# Patient Record
Sex: Female | Born: 1985 | Race: Black or African American | Hispanic: No | Marital: Single | Smoking: Current every day smoker
Health system: Southern US, Community
[De-identification: ages and names within clinical notes are randomized; demographics above are authoritative.]

## PROBLEM LIST (undated history)

## (undated) ENCOUNTER — Inpatient Hospital Stay (HOSPITAL_COMMUNITY): Payer: Self-pay

## (undated) DIAGNOSIS — O24419 Gestational diabetes mellitus in pregnancy, unspecified control: Secondary | ICD-10-CM

## (undated) DIAGNOSIS — R87629 Unspecified abnormal cytological findings in specimens from vagina: Secondary | ICD-10-CM

## (undated) HISTORY — PX: INDUCED ABORTION: SHX677

## (undated) HISTORY — PX: COLPOSCOPY: SHX161

---

## 2007-09-13 ENCOUNTER — Other Ambulatory Visit: Admission: RE | Admit: 2007-09-13 | Discharge: 2007-09-13 | Payer: Self-pay | Admitting: Gynecology

## 2007-11-18 ENCOUNTER — Ambulatory Visit: Payer: Self-pay | Admitting: Gynecology

## 2008-09-08 ENCOUNTER — Emergency Department (HOSPITAL_COMMUNITY): Admission: EM | Admit: 2008-09-08 | Discharge: 2008-09-08 | Payer: Self-pay | Admitting: Emergency Medicine

## 2009-04-25 ENCOUNTER — Other Ambulatory Visit: Admission: RE | Admit: 2009-04-25 | Discharge: 2009-04-25 | Payer: Self-pay | Admitting: Gynecology

## 2009-04-25 ENCOUNTER — Ambulatory Visit: Payer: Self-pay | Admitting: Gynecology

## 2009-11-14 ENCOUNTER — Emergency Department (HOSPITAL_COMMUNITY): Admission: EM | Admit: 2009-11-14 | Discharge: 2009-11-14 | Payer: Self-pay | Admitting: Emergency Medicine

## 2009-12-09 ENCOUNTER — Ambulatory Visit: Payer: Self-pay | Admitting: Gynecology

## 2010-05-28 ENCOUNTER — Encounter: Payer: Self-pay | Admitting: Gynecology

## 2010-05-29 ENCOUNTER — Other Ambulatory Visit (HOSPITAL_COMMUNITY)
Admission: RE | Admit: 2010-05-29 | Discharge: 2010-05-29 | Disposition: A | Discharge status: Home / Self Care | Payer: Managed Care, Other (non HMO) | Source: Ambulatory Visit | Attending: Gynecology | Admitting: Gynecology

## 2010-05-29 ENCOUNTER — Encounter (INDEPENDENT_AMBULATORY_CARE_PROVIDER_SITE_OTHER): Payer: Managed Care, Other (non HMO) | Admitting: Gynecology

## 2010-05-29 ENCOUNTER — Other Ambulatory Visit: Payer: Self-pay | Admitting: Gynecology

## 2010-05-29 DIAGNOSIS — Z113 Encounter for screening for infections with a predominantly sexual mode of transmission: Secondary | ICD-10-CM

## 2010-05-29 DIAGNOSIS — R635 Abnormal weight gain: Secondary | ICD-10-CM

## 2010-05-29 DIAGNOSIS — Z833 Family history of diabetes mellitus: Secondary | ICD-10-CM

## 2010-05-29 DIAGNOSIS — Z124 Encounter for screening for malignant neoplasm of cervix: Secondary | ICD-10-CM | POA: Insufficient documentation

## 2010-05-29 DIAGNOSIS — B373 Candidiasis of vulva and vagina: Secondary | ICD-10-CM

## 2010-05-29 DIAGNOSIS — N898 Other specified noninflammatory disorders of vagina: Secondary | ICD-10-CM

## 2010-05-29 DIAGNOSIS — R82998 Other abnormal findings in urine: Secondary | ICD-10-CM

## 2010-05-29 DIAGNOSIS — Z13220 Encounter for screening for lipoid disorders: Secondary | ICD-10-CM

## 2010-05-29 DIAGNOSIS — Z01419 Encounter for gynecological examination (general) (routine) without abnormal findings: Secondary | ICD-10-CM

## 2010-09-05 ENCOUNTER — Emergency Department (HOSPITAL_COMMUNITY)
Admission: EM | Admit: 2010-09-05 | Discharge: 2010-09-05 | Disposition: A | Discharge status: Home / Self Care | Payer: Self-pay | Attending: Emergency Medicine | Admitting: Emergency Medicine

## 2010-09-05 DIAGNOSIS — B9689 Other specified bacterial agents as the cause of diseases classified elsewhere: Secondary | ICD-10-CM | POA: Insufficient documentation

## 2010-09-05 DIAGNOSIS — L02219 Cutaneous abscess of trunk, unspecified: Secondary | ICD-10-CM | POA: Insufficient documentation

## 2010-09-05 DIAGNOSIS — N760 Acute vaginitis: Secondary | ICD-10-CM | POA: Insufficient documentation

## 2010-09-05 DIAGNOSIS — A499 Bacterial infection, unspecified: Secondary | ICD-10-CM | POA: Insufficient documentation

## 2010-09-05 LAB — WET PREP, GENITAL: Yeast Wet Prep HPF POC: NONE SEEN

## 2010-09-08 ENCOUNTER — Emergency Department (HOSPITAL_COMMUNITY)
Admission: EM | Admit: 2010-09-08 | Discharge: 2010-09-08 | Disposition: A | Discharge status: Home / Self Care | Payer: Self-pay | Attending: Emergency Medicine | Admitting: Emergency Medicine

## 2010-09-08 DIAGNOSIS — L03319 Cellulitis of trunk, unspecified: Secondary | ICD-10-CM | POA: Insufficient documentation

## 2010-09-08 DIAGNOSIS — Z09 Encounter for follow-up examination after completed treatment for conditions other than malignant neoplasm: Secondary | ICD-10-CM | POA: Insufficient documentation

## 2010-09-08 DIAGNOSIS — L02219 Cutaneous abscess of trunk, unspecified: Secondary | ICD-10-CM | POA: Insufficient documentation

## 2010-09-08 LAB — GC/CHLAMYDIA PROBE AMP, GENITAL
Chlamydia, DNA Probe: NEGATIVE
GC Probe Amp, Genital: NEGATIVE

## 2014-10-19 ENCOUNTER — Encounter (HOSPITAL_COMMUNITY): Payer: Self-pay | Admitting: Emergency Medicine

## 2014-10-19 ENCOUNTER — Emergency Department (HOSPITAL_COMMUNITY)
Admission: EM | Admit: 2014-10-19 | Discharge: 2014-10-19 | Disposition: A | Discharge status: Home / Self Care | Payer: PRIVATE HEALTH INSURANCE | Attending: Emergency Medicine | Admitting: Emergency Medicine

## 2014-10-19 ENCOUNTER — Emergency Department (HOSPITAL_COMMUNITY): Payer: PRIVATE HEALTH INSURANCE

## 2014-10-19 DIAGNOSIS — Y9289 Other specified places as the place of occurrence of the external cause: Secondary | ICD-10-CM | POA: Insufficient documentation

## 2014-10-19 DIAGNOSIS — R079 Chest pain, unspecified: Secondary | ICD-10-CM | POA: Diagnosis not present

## 2014-10-19 DIAGNOSIS — Z720 Tobacco use: Secondary | ICD-10-CM | POA: Insufficient documentation

## 2014-10-19 DIAGNOSIS — S0993XA Unspecified injury of face, initial encounter: Secondary | ICD-10-CM | POA: Diagnosis present

## 2014-10-19 DIAGNOSIS — S025XXA Fracture of tooth (traumatic), initial encounter for closed fracture: Secondary | ICD-10-CM | POA: Insufficient documentation

## 2014-10-19 DIAGNOSIS — K0889 Other specified disorders of teeth and supporting structures: Secondary | ICD-10-CM

## 2014-10-19 DIAGNOSIS — Y998 Other external cause status: Secondary | ICD-10-CM | POA: Insufficient documentation

## 2014-10-19 DIAGNOSIS — K029 Dental caries, unspecified: Secondary | ICD-10-CM | POA: Insufficient documentation

## 2014-10-19 DIAGNOSIS — Y9389 Activity, other specified: Secondary | ICD-10-CM | POA: Insufficient documentation

## 2014-10-19 DIAGNOSIS — X58XXXA Exposure to other specified factors, initial encounter: Secondary | ICD-10-CM | POA: Insufficient documentation

## 2014-10-19 LAB — BASIC METABOLIC PANEL
ANION GAP: 8 (ref 5–15)
BUN: 10 mg/dL (ref 6–20)
CALCIUM: 9.3 mg/dL (ref 8.9–10.3)
CO2: 23 mmol/L (ref 22–32)
Chloride: 105 mmol/L (ref 101–111)
Creatinine, Ser: 0.87 mg/dL (ref 0.44–1.00)
GFR calc Af Amer: >60 mL/min (ref >60)
GLUCOSE: 129 mg/dL — AB (ref 65–99)
Potassium: 3.8 mmol/L (ref 3.5–5.1)
SODIUM: 136 mmol/L (ref 135–145)

## 2014-10-19 LAB — I-STAT TROPONIN, ED: Troponin i, poc: 0 ng/mL (ref 0.00–0.08)

## 2014-10-19 LAB — CBC
HCT: 35.4 % — ABNORMAL LOW (ref 36.0–46.0)
HEMOGLOBIN: 11.7 g/dL — AB (ref 12.0–15.0)
MCH: 29.3 pg (ref 26.0–34.0)
MCHC: 33.1 g/dL (ref 30.0–36.0)
MCV: 88.5 fL (ref 78.0–100.0)
Platelets: 295 10*3/uL (ref 150–400)
RBC: 4 MIL/uL (ref 3.87–5.11)
RDW: 13.6 % (ref 11.5–15.5)
WBC: 11.4 10*3/uL — ABNORMAL HIGH (ref 4.0–10.5)

## 2014-10-19 LAB — I-STAT BETA HCG BLOOD, ED (MC, WL, AP ONLY)

## 2014-10-19 MED ORDER — PENICILLIN V POTASSIUM 500 MG PO TABS: 500.0000 mg | ORAL_TABLET | Freq: Four times a day (QID) | ORAL | Status: DC

## 2014-10-19 MED ORDER — HYDROCODONE-ACETAMINOPHEN 5-325 MG PO TABS: 1.0000 | ORAL_TABLET | Freq: Four times a day (QID) | ORAL | Status: DC | PRN

## 2014-10-19 MED ORDER — OXYCODONE-ACETAMINOPHEN 5-325 MG PO TABS
1.0000 | ORAL_TABLET | Freq: Once | ORAL | Status: AC
  Administered 2014-10-19: 1 via ORAL
  Filled 2014-10-19: qty 1

## 2014-10-19 NOTE — Discharge Instructions (Signed)
Return here as needed.  Follow-up with the dentist provided.  Also follow-[facility] with [facility]

## 2014-10-19 NOTE — ED Notes (Signed)
Pt c/o tooth that broke on left later lower side and causing severe pain on left side of face. Pt also c/o right to mid chest pain.

## 2014-10-19 NOTE — ED Provider Notes (Signed)
CSN: [id number]     Arrival date & time 10/19/14  1739 History   First MD Initiated Contact with Patient 10/19/14 1842     Chief Complaint  Patient presents with  . Dental Pain  . Chest Pain     (Consider location/radiation/quality/duration/timing/severity/associated sxs/prior Treatment) HPI Patient presents to the emergency department with dental pain from a broken tooth in the left lower dentition.  Patient states that she is also having right lateral chest pain that she feels it radiates from her jaw or her tooth is broken.  Patient states that nothing seems make her condition better The patient states that cold air makes her pain worse and palpation.  Patient states that she did not take any medications prior to arrival.  The patient states that the chest pain is not constant, it always lasts for a few seconds at a time.  The patient denies shortness of breath, weakness, dizziness, headache, blurred vision, back pain, neck pain, fever, cough, improved.  Abdominal pain, dysuria, incontinence, or syncope.  The patient states that she was chewing on a potato chip she cracked her tooth History reviewed. No pertinent past medical history. History reviewed. No pertinent past surgical history. No family history on file. Social History  Substance Use Topics  . Smoking status: Current Every Day Smoker    Types: Cigarettes  . Smokeless tobacco: None  . Alcohol Use: No   OB History    No data available     Review of Systems   All other systems negative except as documented in the HPI. All pertinent positives and negatives as reviewed in the HPI.= Allergies  Review of patient's allergies indicates no known allergies.  Home Medications   Prior to Admission medications   Not on File   BP 126/73 mmHg  Pulse 92  Temp(Src) 98.4 F (36.9 C)  Resp 13  Ht 5\' 7"  (1.702 m)  Wt 190 lb (86.183 kg)  BMI 29.75 kg/m2  SpO2 100%  LMP  Physical Exam  Constitutional: She is oriented to  person, place, and time. She appears well-developed and well-nourished. No distress.  HENT:  Head: Normocephalic and atraumatic.  Mouth/Throat: Uvula is midline, oropharynx is clear and moist and mucous membranes are normal. She does not have dentures. No trismus in the jaw. Abnormal dentition. Dental caries present. No dental abscesses or uvula swelling.    Eyes: Pupils are equal, round, and reactive to light.  Neck: Normal range of motion. Neck supple.  Cardiovascular: Normal rate and regular rhythm.  Exam reveals no gallop and no friction rub.   No murmur heard. Pulmonary/Chest: Effort normal and breath sounds normal. No respiratory distress.  Musculoskeletal: She exhibits no edema.  Neurological: She is alert and oriented to person, place, and time. She exhibits normal muscle tone. Coordination normal.  Skin: Skin is warm and dry. No rash noted. No erythema.  Psychiatric: She has a normal mood and affect. Her behavior is normal.  Nursing note and vitals reviewed.   ED Course  Procedures (including critical care time) Labs Review Labs Reviewed  BASIC METABOLIC PANEL - Abnormal; Notable for the following:    Glucose, Bld 129 (*)    All other components within normal limits  CBC - Abnormal; Notable for the following:    WBC 11.4 (*)    Hemoglobin 11.7 (*)    HCT 35.4 (*)    All other components within normal limits  I-STAT BETA HCG BLOOD, ED (MC, WL, AP ONLY) - Abnormal; Notable  for the following:    I-stat hCG, quantitative >2000.0 (*)    All other components within normal limits  Rosezena Sensor, ED    Imaging Review Dg Chest 2 View  10/19/2014   CLINICAL DATA:  Right-sided chest pain for 2 days.  EXAM: CHEST  2 VIEW  COMPARISON:  None.  FINDINGS: The heart size and mediastinal contours are within normal limits. Both lungs are clear. No evidence of pneumothorax or pleural effusion. The visualized skeletal structures are unremarkable.  IMPRESSION: Negative.  No active  cardiopulmonary disease.   Electronically Signed   By: Myles Rosenthal M.D.   On: 10/19/2014 19:17   I have personally reviewed and evaluated these images and lab results as part of my medical decision-making.   EKG Interpretation   Date/Time:  Friday October 19 2014 17:48:47 EDT Ventricular Rate:  110 PR Interval:  134 QRS Duration: 78 QT Interval:  313 QTC Calculation: 423 R Axis:   77 Text Interpretation:  Sinus tachycardia ED PHYSICIAN INTERPRETATION  AVAILABLE IN [facility] Confirmed by TEST, Record ([id number]) on  10/20/2014 2:59:47 PM        Patient was advised that she is pregnant.  Advised her to minimally use pain medication and follow up with the dentist provided.  Told to return here as needed.  Given [facility] hospital.  Follow up as well.  She does not have any abdominal pain, vaginal bleeding, vaginal discharge or other abdominal related symptoms  Charlestine Night, PA-C 10/22/14 0117  Lorre Nick, MD 10/23/14 [phone]

## 2014-10-31 ENCOUNTER — Encounter (HOSPITAL_COMMUNITY): Payer: Self-pay | Admitting: Emergency Medicine

## 2014-10-31 ENCOUNTER — Emergency Department (HOSPITAL_COMMUNITY)
Admission: EM | Admit: 2014-10-31 | Discharge: 2014-10-31 | Disposition: A | Discharge status: Home / Self Care | Payer: PRIVATE HEALTH INSURANCE | Attending: Emergency Medicine | Admitting: Emergency Medicine

## 2014-10-31 DIAGNOSIS — K029 Dental caries, unspecified: Secondary | ICD-10-CM | POA: Insufficient documentation

## 2014-10-31 DIAGNOSIS — Z792 Long term (current) use of antibiotics: Secondary | ICD-10-CM | POA: Insufficient documentation

## 2014-10-31 DIAGNOSIS — K088 Other specified disorders of teeth and supporting structures: Secondary | ICD-10-CM | POA: Diagnosis present

## 2014-10-31 DIAGNOSIS — Z720 Tobacco use: Secondary | ICD-10-CM | POA: Insufficient documentation

## 2014-10-31 MED ORDER — IBUPROFEN 800 MG PO TABS
800.0000 mg | ORAL_TABLET | Freq: Once | ORAL | Status: AC
  Administered 2014-10-31: 800 mg via ORAL
  Filled 2014-10-31: qty 1

## 2014-10-31 MED ORDER — IBUPROFEN 800 MG PO TABS: 800.0000 mg | ORAL_TABLET | Freq: Three times a day (TID) | ORAL | Status: DC

## 2014-10-31 NOTE — ED Provider Notes (Signed)
CSN: [id number]     Arrival date & time 10/31/14  1905 History  This chart was scribed for Elpidio Anis, working with Mirian Mo, MD by Chestine Spore, ED Scribe. The patient was seen in room WTR7/WTR7 at 8:16 PM.    Chief Complaint  Patient presents with  . Dental Pain      The history is provided by the patient. No language interpreter was used.    Angela Maldonado is a 29 y.o. female who presents to the Emergency Department complaining of chronic left sided dental pain. Pt was seen in the ED on 10/22/14 and was evaluated and Rx vicodin and penicillin Rx. Pt reports that the pain medication that she received is not working for her and she would like a stronger medication. Pt notes that she just moved to the area and she has [employer] and not [place]. She reports that her [facility] ends today. She denies gum swelling/bleeding, fever, chills, and any other symptoms. Pt denies allergies to any medications. Pt is a current smoker.   History reviewed. No pertinent past medical history. History reviewed. No pertinent past surgical history. History reviewed. No pertinent family history. Social History  Substance Use Topics  . Smoking status: Current Every Day Smoker    Types: Cigarettes  . Smokeless tobacco: None  . Alcohol Use: No   OB History    No data available     Review of Systems  Constitutional: Negative for fever and chills.  HENT: Positive for dental problem. Negative for sore throat and trouble swallowing.        No drainage from the area      Allergies  Review of patient's allergies indicates no known allergies.  Home Medications   Prior to Admission medications   Medication Sig Start Date End Date Taking? Authorizing Provider  HYDROcodone-acetaminophen (NORCO/VICODIN) 5-325 MG per tablet Take 1 tablet by mouth every 6 (six) hours as needed for moderate pain. 10/19/14   Charlestine Night, PA-C  penicillin v potassium (VEETID) 500 MG tablet Take 1 tablet (500 mg  total) by mouth 4 (four) times daily. 10/19/14   Christopher Lawyer, PA-C   BP 139/85 mmHg  Pulse 100  Temp(Src) 98.8 F (37.1 C) (Oral)  Resp 16  SpO2 100% Physical Exam  Constitutional: She is oriented to person, place, and time. She appears well-developed and well-nourished. No distress.  HENT:  Head: Normocephalic and atraumatic.  Mouth/Throat: Dental caries present. No dental abscesses.  #18 has large dental caries without visualized abscess or drainage. No facial swelling. No submental adenopathy.  Eyes: EOM are normal.  Neck: Neck supple. No tracheal deviation present.  Cardiovascular: Normal rate.   Pulmonary/Chest: Effort normal. No respiratory distress.  Musculoskeletal: Normal range of motion.  Lymphadenopathy:       Head (right side): No submental adenopathy present.       Head (left side): No submental adenopathy present.  Neurological: She is alert and oriented to person, place, and time.  Skin: Skin is warm and dry.  Psychiatric: She has a normal mood and affect. Her behavior is normal.  Nursing note and vitals reviewed.   ED Course  Procedures (including critical care time) DIAGNOSTIC STUDIES: Oxygen Saturation is 100% on RA, nl by my interpretation.    COORDINATION OF CARE: 8:20 PM Discussed treatment plan with pt at bedside and pt agreed to plan.    Labs Review Labs Reviewed - No data to display  Imaging Review No results found. Elpidio Anis, PA-C,  have personally reviewed and evaluated these images and lab results as part of my medical decision-making.   EKG Interpretation None      MDM   Final diagnoses:  None    1. Dental pain  She is currently on penicillin and Norco and states her pain is not managed. Dental resources provided. Offered to perform apical injection which she declined. Encouraged her to add ibuprofen to current regimen and call for dental follow up.  I personally performed the services described in this documentation,  which was scribed in my presence. The recorded information has been reviewed and is accurate.     Elpidio Anis, PA-C 10/31/14 2248  Mirian Mo, MD 11/02/14 [phone]

## 2014-10-31 NOTE — ED Notes (Signed)
Pt c/o chronic l side dental pain. Was seen here a week ago and rx'd viocdin and reports its not working.  Wants a stronger pain medication.

## 2014-10-31 NOTE — Discharge Instructions (Signed)
Dental Care and Dentist Visits Dental care supports good overall health. Regular dental visits can also help you avoid dental pain, bleeding, infection, and other more serious health problems in the future. It is important to keep the mouth healthy because diseases in the teeth, gums, and other oral tissues can spread to other areas of the body. Some problems, such as diabetes, heart disease, and pre-term labor have been associated with poor oral health.  See your dentist every 6 months. If you experience emergency problems such as a toothache or broken tooth, go to the dentist right away. If you see your dentist regularly, you may catch problems early. It is easier to be treated for problems in the early stages.  WHAT TO EXPECT AT A DENTIST VISIT  Your dentist will look for many common oral health problems and recommend proper treatment. At your regular dental visit, you can expect:  Gentle cleaning of the teeth and gums. This includes scraping and polishing. This helps to remove the sticky substance around the teeth and gums (plaque). Plaque forms in the mouth shortly after eating. Over time, plaque hardens on the teeth as tartar. If tartar is not removed regularly, it can cause problems. Cleaning also helps remove stains.  Periodic X-rays. These pictures of the teeth and supporting bone will help your dentist assess the health of your teeth.  Periodic fluoride treatments. Fluoride is a natural mineral shown to help strengthen teeth. Fluoride treatmentinvolves applying a fluoride gel or varnish to the teeth. It is most commonly done in children.  Examination of the mouth, tongue, jaws, teeth, and gums to look for any oral health problems, such as:  Cavities (dental caries). This is decay on the tooth caused by plaque, sugar, and acid in the mouth. It is best to catch a cavity when it is small.  Inflammation of the gums caused by plaque buildup (gingivitis).  Problems with the mouth or malformed  or misaligned teeth.  Oral cancer or other diseases of the soft tissues or jaws. KEEP YOUR TEETH AND GUMS HEALTHY For healthy teeth and gums, follow these general guidelines as well as your dentist's specific advice:  Have your teeth professionally cleaned at the dentist every 6 months.  Brush twice daily with a fluoride toothpaste.  Floss your teeth daily.  Ask your dentist if you need fluoride supplements, treatments, or fluoride toothpaste.  Eat a healthy diet. Reduce foods and drinks with added sugar.  Avoid smoking. TREATMENT FOR ORAL HEALTH PROBLEMS If you have oral health problems, treatment varies depending on the conditions present in your teeth and gums.  Your caregiver will most likely recommend good oral hygiene at each visit.  For cavities, gingivitis, or other oral health disease, your caregiver will perform a procedure to treat the problem. This is typically done at a separate appointment. Sometimes your caregiver will refer you to another dental specialist for specific tooth problems or for surgery. SEEK IMMEDIATE DENTAL CARE IF:  You have pain, bleeding, or soreness in the gum, tooth, jaw, or mouth area.  A permanent tooth becomes loose or separated from the gum socket.  You experience a blow or injury to the mouth or jaw area. Document Released: 10/29/2010 Document Revised: 05/11/2011 Document Reviewed: 10/29/2010 [facility] Patient Information 2015 [employer] Stein[employer], [place]. This information is not intended to replace advice given to you by your health care provider. Make sure you discuss any questions you have with your health care provider.  Dental Caries Dental caries is tooth decay. This  decay can cause a hole in teeth (cavity) that can get bigger and deeper over time. HOME CARE  Brush and floss your teeth. Do this at least two times a day.  Use a fluoride toothpaste.  Use a mouth rinse if told by your dentist or doctor.  Eat less sugary and starchy foods.  Drink less sugary drinks.  Avoid snacking often on sugary and starchy foods. Avoid sipping often on sugary drinks.  Keep regular checkups and cleanings with your dentist.  Use fluoride supplements if told by your dentist or doctor.  Allow fluoride to be applied to teeth if told by your dentist or doctor. Document Released: 11/26/2007 Document Revised: 07/03/2013 Document Reviewed: 02/19/2012 [facility] Patient Information 2015 [place], [place]. This information is not intended to replace advice given to you by your health care provider. Make sure you discuss any questions you have with your health care provider.  Emergency Department Resource Guide 1) Find a Doctor and Pay Out of Pocket Although you won't have to find out who is covered by your insurance plan, it is a good idea to ask around and get recommendations. You will then need to call the office and see if the doctor you have chosen will accept you as a new patient and what types of options they offer for patients who are self-pay. Some doctors offer discounts or will set up payment plans for their patients who do not have insurance, but you will need to ask so you aren't surprised when you get to your appointment.  2) Contact Your Local Health Department Not all health departments have doctors that can see patients for sick visits, but many do, so it is worth a call to see if yours does. If you don't know where your local health department is, you can check in your phone book. The CDC also has a tool to help you locate your state's health department, and many state websites also have listings of all of their local health departments.  3) Find a Walk-in Clinic If your illness is not likely to be very severe or complicated, you may want to try a walk in clinic. These are popping up all over the country in pharmacies, drugstores, and shopping centers. They're usually staffed by nurse practitioners or physician assistants that have been trained  to treat common illnesses and complaints. They're usually fairly quick and inexpensive. However, if you have serious medical issues or chronic medical problems, these are probably not your best option.  No Primary Care Doctor: - Call Health Connect at  [phone] - they can help you locate a primary care doctor that  accepts your insurance, provides certain services, etc. - Physician Referral Service- [phone]  Chronic Pain Problems: Organization         Address  Phone   Notes  [facility]  [phone] Patients need to be referred by their primary care doctor.   Medication Assistance: Organization         Address  Phone   Notes  [facility] [address] [place], [place] [phone] --Must be a resident of [facility] [place] [facility] -- Must have NO insurance coverage whatsoever (no Medicaid/ Medicare, etc.) -- The pt. MUST have a primary care doctor that directs their care regularly and follows them in the community   [facility]  [phone]   [employer]  [phone]    Agencies that provide inexpensive medical care: Organization  Address  Phone   Notes  [place]  [phone]   [facility]    [phone]   [facility] [place], [place] [address] [phone]   [place] [address] [place], [place] [phone]   Planned Parenthood    [phone]   [facility]    [phone]   [place] [address], [place] Phone:  [phone], Fax:  [phone] Hours of Operation:  9 am - 6 pm, M-F.  Also accepts Medicaid/Medicare and self-pay.  [facility] for [place], [address], [place] Phone: [phone], Fax: [phone]. Hours of Operation:  8:30 am - 5:30 pm, M-F.  Also accepts Medicaid and self-pay.   [facility] [place] [address] [place] Phone: [phone]   [place], [place], [place] [phone], Ext. [phone] Mondays & Thursdays: 7-9 AM.  First 15 patients are seen on a first come, first serve basis.    [place] Providers:  Organization         Address  Phone   Notes  [facility] [address], [place] [phone] Also accepts self-pay patients.  [facility] [address], [place]  [phone]   [place], [address], [place] [phone]   [facility] [address] [place] [phone]   Lucianne Lei [address], [place]   [phone] Only accepts [place] Access [place] patients after they have their name applied to their card.   Self-Pay (no insurance) in [facility]:  Organization         Address  Phone   Notes  Sickle Cell Patients, [facility] [place] [phone]   [facility] [place] [phone]   [facility] [place], [place], [place] [phone]   [facility]/Dr. Osei-Bonsu  [address], [place] or [address], [place] [phone] Phone number for both [place] and [place] locations is the same.  [facility] [address] [place] [phone]   [facility] [address] [place] or [address] [phone]   [facility] [address], [place] [phone], phone; [phone], fax Sees patients 1st and 3rd Saturday of every month.  Must not qualify for public or private insurance (i.e. Medicaid, Medicare, [place] Health Choice, Veterans' Benefits)  Household income should be no  more than 200% of the poverty level The clinic cannot treat you if you are pregnant or think you are pregnant  Sexually transmitted diseases are not treated at the clinic.    Dental Care: Organization         Address  Phone  Notes  [facility] [place] [phone] Accepts children up to age 69 who are enrolled in [place] or [place]; pregnant women with a Medicaid card; and children who have applied for Medicaid or [place] Health Choice, but were declined, whose parents can pay a reduced fee at time  of service.  [facility]  [address], [place] [phone] Accepts children up to age 36 who are enrolled in [place] or [place] Health Choice; pregnant women with a Medicaid card; and children who have applied for Medicaid or [place] Health Choice, but were declined, whose parents can pay a reduced fee at time of service.  [facility]  [address] [place], [place] [phone] Patients are seen by appointment only. Walk-ins are not accepted. [facility] will see patients 87 years of age and older. Monday - Tuesday (8am-5pm) Most Wednesdays (8:30-5pm) $30 per visit, cash only  [facility]  [address], [facility] [phone] Patients are seen by appointment only. Walk-ins are not accepted. [facility] will see patients 19 years of age and older. One Wednesday Evening (Monthly: Volunteer Based).  $30 per visit, cash only  [employer]  [phone] for adults; Children under age 53, call Graduate Pediatric Dentistry at [phone]. Children aged 47-14, please call [phone] to request a pediatric application.  Dental services are provided in all areas of dental care including fillings, crowns and bridges, complete and partial dentures, implants, gum treatment, root canals,  and extractions. Preventive care is also provided. Treatment is provided to both adults and children. Patients are selected via a lottery and there is often a waiting list.   [facility] [address], [place]  [phone] [email or web address]   [facility] [address] [place], [place] [phone], Ext. [phone] Second and Fourth Thursday of each month, opens at 6:30 AM; Clinic ends at 9 AM.  Patients are seen on a first-come first-served basis, and a limited number are seen during each clinic.   [facility]  [address] [place], [place] [phone]   Eligibility Requirements You must have lived in [place], [place], or [place] counties for at least the last three months.   You cannot be eligible for state or federal sponsored [place], including [employer], [place], or [employer].   You generally cannot be eligible for healthcare insurance through your employer.    How to apply: Eligibility screenings are held every Tuesday and Wednesday afternoon from 1:00 pm until 4:00 pm. You do not need an appointment for the interview!  [facility] [address] [place], [place] [phone]   [facility] Health Department  [phone]   [facility]  [phone]   [facility]  [phone]

## 2014-11-26 ENCOUNTER — Inpatient Hospital Stay (HOSPITAL_COMMUNITY): Payer: Medicaid Other

## 2014-11-26 ENCOUNTER — Inpatient Hospital Stay (HOSPITAL_COMMUNITY)
Admission: AD | Admit: 2014-11-26 | Discharge: 2014-11-26 | Disposition: A | Discharge status: Home / Self Care | Payer: Medicaid Other | Source: Ambulatory Visit | Attending: Family Medicine | Admitting: Family Medicine

## 2014-11-26 ENCOUNTER — Encounter (HOSPITAL_COMMUNITY): Payer: Self-pay | Admitting: *Deleted

## 2014-11-26 DIAGNOSIS — R05 Cough: Secondary | ICD-10-CM | POA: Diagnosis present

## 2014-11-26 DIAGNOSIS — R102 Pelvic and perineal pain: Secondary | ICD-10-CM

## 2014-11-26 DIAGNOSIS — R312 Other microscopic hematuria: Secondary | ICD-10-CM | POA: Diagnosis not present

## 2014-11-26 DIAGNOSIS — J069 Acute upper respiratory infection, unspecified: Secondary | ICD-10-CM | POA: Insufficient documentation

## 2014-11-26 DIAGNOSIS — N3001 Acute cystitis with hematuria: Secondary | ICD-10-CM

## 2014-11-26 DIAGNOSIS — Z3A14 14 weeks gestation of pregnancy: Secondary | ICD-10-CM | POA: Insufficient documentation

## 2014-11-26 DIAGNOSIS — F17200 Nicotine dependence, unspecified, uncomplicated: Secondary | ICD-10-CM | POA: Insufficient documentation

## 2014-11-26 DIAGNOSIS — O26892 Other specified pregnancy related conditions, second trimester: Secondary | ICD-10-CM | POA: Insufficient documentation

## 2014-11-26 DIAGNOSIS — O26899 Other specified pregnancy related conditions, unspecified trimester: Secondary | ICD-10-CM

## 2014-11-26 HISTORY — DX: Gestational diabetes mellitus in pregnancy, unspecified control: O24.419

## 2014-11-26 HISTORY — DX: Unspecified abnormal cytological findings in specimens from vagina: R87.629

## 2014-11-26 LAB — URINE MICROSCOPIC-ADD ON

## 2014-11-26 LAB — URINALYSIS, ROUTINE W REFLEX MICROSCOPIC
BILIRUBIN URINE: NEGATIVE
GLUCOSE, UA: NEGATIVE mg/dL
Ketones, ur: NEGATIVE mg/dL
Nitrite: NEGATIVE
PROTEIN: NEGATIVE mg/dL
SPECIFIC GRAVITY, URINE: 1.025 (ref 1.005–1.030)
UROBILINOGEN UA: 0.2 mg/dL (ref 0.0–1.0)
pH: 6.5 (ref 5.0–8.0)

## 2014-11-26 MED ORDER — GUAIFENESIN ER 600 MG PO TB12: 600.0000 mg | ORAL_TABLET | Freq: Two times a day (BID) | ORAL | Status: DC

## 2014-11-26 MED ORDER — PRENATAL VITAMINS 0.8 MG PO TABS: 1.0000 | ORAL_TABLET | Freq: Every day | ORAL | Status: DC

## 2014-11-26 MED ORDER — AZITHROMYCIN 250 MG PO TABS: 250.0000 mg | ORAL_TABLET | Freq: Every day | ORAL | Status: DC

## 2014-11-26 NOTE — Discharge Instructions (Signed)
Second Trimester of Pregnancy °The second trimester is from week 13 through week 28, months 4 through 6. The second trimester is often a time when you feel your best. Your body has also adjusted to being pregnant, and you begin to feel better physically. Usually, morning sickness has lessened or quit completely, you may have more energy, and you may have an increase in appetite. The second trimester is also a time when the fetus is growing rapidly. At the end of the sixth month, the fetus is about 9 inches long and weighs about 1½ pounds. You will likely begin to feel the baby move (quickening) between 18 and 20 weeks of the pregnancy. °BODY CHANGES °Your body goes through many changes during pregnancy. The changes vary from woman to woman.  °· Your weight will continue to increase. You will notice your lower abdomen bulging out. °· You may begin to get stretch marks on your hips, abdomen, and breasts. °· You may develop headaches that can be relieved by medicines approved by your health care provider. °· You may urinate more often because the fetus is pressing on your bladder. °· You may develop or continue to have heartburn as a result of your pregnancy. °· You may develop constipation because certain hormones are causing the muscles that push waste through your intestines to slow down. °· You may develop hemorrhoids or swollen, bulging veins (varicose veins). °· You may have back pain because of the weight gain and pregnancy hormones relaxing your joints between the bones in your pelvis and as a result of a shift in weight and the muscles that support your balance. °· Your breasts will continue to grow and be tender. °· Your gums may bleed and may be sensitive to brushing and flossing. °· Dark spots or blotches (chloasma, mask of pregnancy) may develop on your face. This will likely fade after the baby is born. °· A dark line from your belly button to the pubic area (linea nigra) may appear. This will likely fade  after the baby is born. °· You may have changes in your hair. These can include thickening of your hair, rapid growth, and changes in texture. Some women also have hair loss during or after pregnancy, or hair that feels dry or thin. Your hair will most likely return to normal after your baby is born. °WHAT TO EXPECT AT YOUR PRENATAL VISITS °During a routine prenatal visit: °· You will be weighed to make sure you and the fetus are growing normally. °· Your blood pressure will be taken. °· Your abdomen will be measured to track your baby's growth. °· The fetal heartbeat will be listened to. °· Any test results from the previous visit will be discussed. °Your health care provider may ask you: °· How you are feeling. °· If you are feeling the baby move. °· If you have had any abnormal symptoms, such as leaking fluid, bleeding, severe headaches, or abdominal cramping. °· If you have any questions. °Other tests that may be performed during your second trimester include: °· Blood tests that check for: °¨ Low iron levels (anemia). °¨ Gestational diabetes (between 24 and 28 weeks). °¨ Rh antibodies. °· Urine tests to check for infections, diabetes, or protein in the urine. °· An ultrasound to confirm the proper growth and development of the baby. °· An amniocentesis to check for possible genetic problems. °· Fetal screens for spina bifida and Down syndrome. °HOME CARE INSTRUCTIONS  °· Avoid all smoking, herbs, alcohol, and unprescribed   drugs. These chemicals affect the formation and growth of the baby.  Follow your health care provider's instructions regarding medicine use. There are medicines that are either safe or unsafe to take during pregnancy.  Exercise only as directed by your health care provider. Experiencing uterine cramps is a good sign to stop exercising.  Continue to eat regular, healthy meals.  Wear a good support bra for breast tenderness.  Do not use hot tubs, steam rooms, or saunas.  Wear your  seat belt at all times when driving.  Avoid raw meat, uncooked cheese, cat litter boxes, and soil used by cats. These carry germs that can cause birth defects in the baby.  Take your prenatal vitamins.  Try taking a stool softener (if your health care provider approves) if you develop constipation. Eat more high-fiber foods, such as fresh vegetables or fruit and whole grains. Drink plenty of fluids to keep your urine clear or pale yellow.  Take warm sitz baths to soothe any pain or discomfort caused by hemorrhoids. Use hemorrhoid cream if your health care provider approves.  If you develop varicose veins, wear support hose. Elevate your feet for 15 minutes, 3-4 times a day. Limit salt in your diet.  Avoid heavy lifting, wear low heel shoes, and practice good posture.  Rest with your legs elevated if you have leg cramps or low back pain.  Visit your dentist if you have not gone yet during your pregnancy. Use a soft toothbrush to brush your teeth and be gentle when you floss.  A sexual relationship may be continued unless your health care provider directs you otherwise.  Continue to go to all your prenatal visits as directed by your health care provider. SEEK MEDICAL CARE IF:   You have dizziness.  You have mild pelvic cramps, pelvic pressure, or nagging pain in the abdominal area.  You have persistent nausea, vomiting, or diarrhea.  You have a bad smelling vaginal discharge.  You have pain with urination. SEEK IMMEDIATE MEDICAL CARE IF:   You have a fever.  You are leaking fluid from your vagina.  You have spotting or bleeding from your vagina.  You have severe abdominal cramping or pain.  You have rapid weight gain or loss.  You have shortness of breath with chest pain.  You notice sudden or extreme swelling of your face, hands, ankles, feet, or legs.  You have not felt your baby move in over an hour.  You have severe headaches that do not go away with  medicine.  You have vision changes. Document Released: 02/10/2001 Document Revised: 02/21/2013 Document Reviewed: 04/19/2012 [facility] Patient Information 2015 [place], [place]. This information is not intended to replace advice given to you by your health care provider. Make sure you discuss any questions you have with your health care provider. Upper Respiratory Infection, Adult An upper respiratory infection (URI) is also sometimes known as the common cold. The upper respiratory tract includes the nose, sinuses, throat, trachea, and bronchi. Bronchi are the airways leading to the lungs. Most people improve within 1 week, but symptoms can last up to 2 weeks. A residual cough may last even longer.  CAUSES Many different viruses can infect the tissues lining the upper respiratory tract. The tissues become irritated and inflamed and often become very moist. Mucus production is also common. A cold is contagious. You can easily spread the virus to others by oral contact. This includes kissing, sharing a glass, coughing, or sneezing. Touching your mouth or nose and  then touching a surface, which is then touched by another person, can also spread the virus. SYMPTOMS  Symptoms typically develop 1 to 3 days after you come in contact with a cold virus. Symptoms vary from person to person. They may include:  Runny nose.  Sneezing.  Nasal congestion.  Sinus irritation.  Sore throat.  Loss of voice (laryngitis).  Cough.  Fatigue.  Muscle aches.  Loss of appetite.  Headache.  Low-grade fever. DIAGNOSIS  You might diagnose your own cold based on familiar symptoms, since most people get a cold 2 to 3 times a year. Your caregiver can confirm this based on your exam. Most importantly, your caregiver can check that your symptoms are not due to another disease such as strep throat, sinusitis, pneumonia, asthma, or epiglottitis. Blood tests, throat tests, and X-rays are not necessary to diagnose a  common cold, but they may sometimes be helpful in excluding other more serious diseases. Your caregiver will decide if any further tests are required. RISKS AND COMPLICATIONS  You may be at risk for a more severe case of the common cold if you smoke cigarettes, have chronic heart disease (such as heart failure) or lung disease (such as asthma), or if you have a weakened immune system. The very young and very old are also at risk for more serious infections. Bacterial sinusitis, middle ear infections, and bacterial pneumonia can complicate the common cold. The common cold can worsen asthma and chronic obstructive pulmonary disease (COPD). Sometimes, these complications can require emergency medical care and may be life-threatening. PREVENTION  The best way to protect against getting a cold is to practice good hygiene. Avoid oral or hand contact with people with cold symptoms. Wash your hands often if contact occurs. There is no clear evidence that vitamin C, vitamin E, echinacea, or exercise reduces the chance of developing a cold. However, it is always recommended to get plenty of rest and practice good nutrition. TREATMENT  Treatment is directed at relieving symptoms. There is no cure. Antibiotics are not effective, because the infection is caused by a virus, not by bacteria. Treatment may include:  Increased fluid intake. Sports drinks offer valuable electrolytes, sugars, and fluids.  Breathing heated mist or steam (vaporizer or shower).  Eating chicken soup or other clear broths, and maintaining good nutrition.  Getting plenty of rest.  Using gargles or lozenges for comfort.  Controlling fevers with ibuprofen or acetaminophen as directed by your caregiver.  Increasing usage of your inhaler if you have asthma. Zinc gel and zinc lozenges, taken in the first 24 hours of the common cold, can shorten the duration and lessen the severity of symptoms. Pain medicines may help with fever, muscle  aches, and throat pain. A variety of non-prescription medicines are available to treat congestion and runny nose. Your caregiver can make recommendations and may suggest nasal or lung inhalers for other symptoms.  HOME CARE INSTRUCTIONS   Only take over-the-counter or prescription medicines for pain, discomfort, or fever as directed by your caregiver.  Use a warm mist humidifier or inhale steam from a shower to increase air moisture. This may keep secretions moist and make it easier to breathe.  Drink enough water and fluids to keep your urine clear or pale yellow.  Rest as needed.  Return to work when your temperature has returned to normal or as your caregiver advises. You may need to stay home longer to avoid infecting others. You can also use a face mask and careful hand  washing to prevent spread of the virus. SEEK MEDICAL CARE IF:   After the first few days, you feel you are getting worse rather than better.  You need your caregiver's advice about medicines to control symptoms.  You develop chills, worsening shortness of breath, or brown or red sputum. These may be signs of pneumonia.  You develop yellow or brown nasal discharge or pain in the face, especially when you bend forward. These may be signs of sinusitis.  You develop a fever, swollen neck glands, pain with swallowing, or white areas in the back of your throat. These may be signs of strep throat. SEEK IMMEDIATE MEDICAL CARE IF:   You have a fever.  You develop severe or persistent headache, ear pain, sinus pain, or chest pain.  You develop wheezing, a prolonged cough, cough up blood, or have a change in your usual mucus (if you have chronic lung disease).  You develop sore muscles or a stiff neck. Document Released: 08/12/2000 Document Revised: 05/11/2011 Document Reviewed: 05/24/2013 [facility] Patient Information 2015 [place], [place]. This information is not intended to replace advice given to you by your health care  provider. Make sure you discuss any questions you have with your health care provider.

## 2014-11-26 NOTE — MAU Provider Note (Signed)
History     CSN: [id number]  Arrival date and time: 11/26/14 0840   First Provider Initiated Contact with Patient 11/26/14 1014     This is a 29 y.o. female at [redacted]w[redacted]d by fundal height (very unsure dates, no definite LMP) who presents with c/o productive cough for a week. With repeated coughing, she has pain in her umbilical area. Also has pain in her epigastric/sternal area when she coughs. Denies fever or sore throat.   Unsure LMP. Was not having periods. States she needs an OB, that she called a lot of practices and no one would take her even though she has Medicaid already (just moved from [place]). Also needs dental letter. Having wisdom teeth removed tomorrow.    Cough This is a new problem. The current episode started in the past 7 days. The problem has been unchanged. The cough is productive of sputum. Associated symptoms include chest pain (sternal with coughing), nasal congestion and rhinorrhea. Pertinent negatives include no chills, ear congestion, fever, headaches, myalgias, sore throat, shortness of breath, sweats, weight loss or wheezing. Nothing aggravates the symptoms. She has tried nothing for the symptoms. There is no history of asthma or pneumonia.    Preg. Test 10/19/14. Quant. Was 2000 according to chart. Pain in chest and abd. when cough. Need a OB doctor and letter for Medicaid.           Expand All Collapse All   Unsure of LMP, was on birthcontrol and was not having periods. Thinks it was 3-4 months ago that she went off. Found out in Aug. Pain is only when she coughs, had cough about a wk, is productive.           OB History    Gravida Para Term Preterm AB TAB SAB Ectopic Multiple Living   0 2 2   0 2      Obstetric Comments   Induced, gest diab- diet controlled      No past medical history on file.  No past surgical history on file.  No family history on file.  Social History  Substance Use Topics  . Smoking status: Current Every Day  Smoker    Types: Cigarettes  . Smokeless tobacco: Not on file  . Alcohol Use: No    Allergies: No Known Allergies  Prescriptions prior to admission  Medication Sig Dispense Refill Last Dose  . HYDROcodone-acetaminophen (NORCO/VICODIN) 5-325 MG per tablet Take 1 tablet by mouth every 6 (six) hours as needed for moderate pain. 15 tablet 0   . ibuprofen (ADVIL,MOTRIN) 800 MG tablet Take 1 tablet (800 mg total) by mouth 3 (three) times daily. 21 tablet 0   . penicillin v potassium (VEETID) 500 MG tablet Take 1 tablet (500 mg total) by mouth 4 (four) times daily. 28 tablet 0    Medical, Surgical, Family and Social histories reviewed and are listed above.  Medications and allergies reviewed.   Review of Systems  Constitutional: Positive for malaise/fatigue. Negative for fever, chills and weight loss.  HENT: Positive for congestion and rhinorrhea. Negative for sore throat.   Respiratory: Positive for cough and sputum production. Negative for shortness of breath and wheezing.   Cardiovascular: Positive for chest pain (sternal with coughing).  Gastrointestinal: Positive for abdominal pain (around umbilicus with coughing only ). Negative for nausea, vomiting, diarrhea and constipation.  Genitourinary: Negative for dysuria.  Musculoskeletal: Negative for myalgias.  Neurological: Negative for dizziness, focal weakness and headaches.  other systems negative  Physical Exam   Blood pressure 113/67, pulse 92, resp. rate 18, height  (1.626 m), weight 182 lb (82.555 kg), SpO2 100 %.  Physical Exam  Constitutional: She is oriented to person, place, and time. She appears well-developed and well-nourished. No distress.  HENT:  Head: Normocephalic.  Neck: Normal range of motion. Neck supple. No tracheal deviation present.  Cardiovascular: Normal rate, regular rhythm and normal heart sounds.  Exam reveals no gallop and no friction rub.   No murmur heard. Respiratory: Effort normal. No  respiratory distress. She has wheezes (bilateral bases). She has no rales. She exhibits no tenderness.  GI: Soft. She exhibits no distension and no mass. There is no tenderness (No sign of hernia or tenderness at umbilicus). There is no rebound and no guarding.  Musculoskeletal: Normal range of motion.  Neurological: She is alert and oriented to person, place, and time.  Skin: Skin is warm and dry.  Psychiatric: She has a normal mood and affect.    MAU Course  Procedures  MDM Will get CXR due to the bilateral basilar wheezes audible.  Bedside US done which shows single IUP, active. FHR 150s   Adequate fluid.   Results for orders placed or performed during the hospital encounter of 11/26/14 (from the past 24 hour(s))  Urinalysis, Routine w reflex microscopic (not at [facility])     Status: Abnormal   Collection Time: 11/26/14 10:00 AM  Result Value Ref Range   Color, Urine YELLOW YELLOW   APPearance HAZY (A) CLEAR   Specific Gravity, Urine 1.025 1.005 - 1.030   pH 6.5 5.0 - 8.0   Glucose, UA NEGATIVE NEGATIVE mg/dL   Hgb urine dipstick TRACE (A) NEGATIVE   Bilirubin Urine NEGATIVE NEGATIVE   Ketones, ur NEGATIVE NEGATIVE mg/dL   Protein, ur NEGATIVE NEGATIVE mg/dL   Urobilinogen, UA 0.2 0.0 - 1.0 mg/dL   Nitrite NEGATIVE NEGATIVE   Leukocytes, UA SMALL (A) NEGATIVE  Urine microscopic-add on     Status: Abnormal   Collection Time: 11/26/14 10:00 AM  Result Value Ref Range   Squamous Epithelial / LPF MANY (A) RARE   WBC, UA 3-6 <3 WBC/hpf   RBC / HPF 0-2 <3 RBC/hpf   Bacteria, UA FEW (A) RARE   Urine-Other MUCOUS PRESENT    Dg Chest 2 View  11/26/2014   CLINICAL DATA:  Cough, congestion, chest pain for several days. Smoker.  EXAM: CHEST  2 VIEW  COMPARISON:  10/19/2014  FINDINGS: The heart size and mediastinal contours are within normal limits. Both lungs are clear. The visualized skeletal structures are unremarkable.  IMPRESSION: No active cardiopulmonary disease.   Electronically  Signed   By: Charlett Nose M.D.   On: 11/26/2014 11:38   Evaluation in the Emergency Department is complete. It has been determined that no acute conditions requiring further emergency intervention are present at this time. Pt is hemodynamically stable and mentating appropriately. Discussed findings and plan with patient, who agrees with care plan. All questions answered. We have discussed signs and symptoms that warrant return to the ED, such as changes or worsening in symptoms, worsening rash, fever.      Assessment and Plan  A:  SIUP at [redacted]w[redacted]d by fundal height       Upper Respiratory Infection, no evidence of pneumonia, probable bronchitis       Unsure dates      Microscopic hematuria, possible UTI  P:  Discharge home       Rx Z-pack for  probable bronchitis       Rx Mucinex       Will schedule outpatient [place] for dating       Referred to clinic for prenatal care       Letters given for dental care and proof of pregnancy  [facility] 11/26/2014, 10:14 AM

## 2014-11-26 NOTE — MAU Note (Addendum)
Unsure of LMP, was on birthcontrol and was not having periods.  Thinks it was 3-4 months ago that she went off.  Found out in Aug. Pain is only when she coughs, had cough about a wk, is productive.

## 2014-11-26 NOTE — MAU Note (Signed)
Urine in lab 

## 2014-11-26 NOTE — MAU Note (Signed)
Preg. Test 10/19/14. Quant. Was 2000 according to chart. Pain in chest and abd. when cough. Need a OB doctor and letter for Medicaid.

## 2014-11-26 NOTE — MAU Note (Signed)
Not in lobby, ? With lab

## 2014-11-27 LAB — CULTURE, OB URINE: SPECIAL REQUESTS: NORMAL

## 2014-12-24 ENCOUNTER — Telehealth: Payer: Self-pay | Admitting: *Deleted

## 2014-12-24 DIAGNOSIS — O09813 Supervision of pregnancy resulting from assisted reproductive technology, third trimester: Secondary | ICD-10-CM

## 2014-12-24 NOTE — Telephone Encounter (Addendum)
Per [name] patient needs dating ultrasound due to very unsure dates.  Scheduled ultrasound for  12/31/14 11:00. Has new ob visit 01/01/15.   Called home number and left a message we have made you an appointment for ultrasound 12/31/14 11:00- please call if you have any questions.  Called mobile number and was unable to leave a message-heard person was not taking calls.

## 2014-12-24 NOTE — Telephone Encounter (Signed)
Opened in error

## 2014-12-26 NOTE — Telephone Encounter (Signed)
Called Angela Maldonado and left a message we are calling to let you know we have scheduled an ultrasound appointment for 12/31/14 at 11:00. Please call if you have questions.

## 2014-12-27 NOTE — Telephone Encounter (Signed)
Called patient at preferred number, no answer- left message stating we are trying to reach you in regards to an appt, please call us back at the clinics. Called patient at other number and line immediately went to voicemail, no option to leave message.

## 2014-12-31 ENCOUNTER — Ambulatory Visit (HOSPITAL_COMMUNITY)
Admission: RE | Admit: 2014-12-31 | Discharge: 2014-12-31 | Disposition: A | Discharge status: Home / Self Care | Payer: Medicaid Other | Source: Ambulatory Visit | Attending: Gynecology | Admitting: Gynecology

## 2014-12-31 ENCOUNTER — Other Ambulatory Visit: Payer: Self-pay | Admitting: Advanced Practice Midwife

## 2014-12-31 DIAGNOSIS — Z1389 Encounter for screening for other disorder: Secondary | ICD-10-CM

## 2014-12-31 DIAGNOSIS — Z3A19 19 weeks gestation of pregnancy: Secondary | ICD-10-CM | POA: Diagnosis not present

## 2014-12-31 DIAGNOSIS — Z36 Encounter for antenatal screening of mother: Secondary | ICD-10-CM | POA: Insufficient documentation

## 2014-12-31 DIAGNOSIS — Z3687 Encounter for antenatal screening for uncertain dates: Secondary | ICD-10-CM

## 2014-12-31 DIAGNOSIS — Z3492 Encounter for supervision of normal pregnancy, unspecified, second trimester: Secondary | ICD-10-CM

## 2014-12-31 DIAGNOSIS — O09813 Supervision of pregnancy resulting from assisted reproductive technology, third trimester: Secondary | ICD-10-CM

## 2014-12-31 NOTE — Telephone Encounter (Signed)
Per chart review, pt kept US appt today as scheduled. No further need to contact pt. Next clinic appt scheduled tomorrow.

## 2015-01-01 ENCOUNTER — Ambulatory Visit (INDEPENDENT_AMBULATORY_CARE_PROVIDER_SITE_OTHER): Payer: Self-pay | Admitting: Certified Nurse Midwife

## 2015-01-01 ENCOUNTER — Encounter: Payer: Self-pay | Admitting: Certified Nurse Midwife

## 2015-01-01 VITALS — BP 120/67 | HR 94 | Temp 98.3°F | Wt 185.4 lb

## 2015-01-01 DIAGNOSIS — Z3482 Encounter for supervision of other normal pregnancy, second trimester: Secondary | ICD-10-CM

## 2015-01-01 DIAGNOSIS — Z3490 Encounter for supervision of normal pregnancy, unspecified, unspecified trimester: Secondary | ICD-10-CM | POA: Insufficient documentation

## 2015-01-01 LAB — POCT URINALYSIS DIP (DEVICE)
Bilirubin Urine: NEGATIVE
Glucose, UA: NEGATIVE mg/dL
Hgb urine dipstick: NEGATIVE
Leukocytes, UA: NEGATIVE
Nitrite: NEGATIVE
Protein, ur: NEGATIVE mg/dL
Specific Gravity, Urine: 1.025 (ref 1.005–1.030)
Urobilinogen, UA: 1 mg/dL (ref 0.0–1.0)
pH: 6.5 (ref 5.0–8.0)

## 2015-01-01 NOTE — Progress Notes (Signed)
     Subjective:    Angela DoffingBarbara Maldonado is a [id number] being seen today for her first obstetrical visit.  Her obstetrical history is significant for smoker. Patient does intend to breast feed. Pregnancy history fully reviewed.  Patient reports no complaints.  Filed Vitals:   01/01/15 1301  BP: 120/67  Pulse: 94  Temp: 98.3 F (36.8 C)  Weight: 185 lb 6.4 oz (84.097 kg)    HISTORY: OB History  Gravida Para Term Preterm AB SAB TAB Ectopic Multiple Living  5 2 2  0 2  2  0 2    # Outcome Date GA Lbr Len/2nd Weight Sex Delivery Anes PTL Lv  5 Current           4 Term 03/12/13   7 lb (3.175 kg) F  EPI Y Y     Comments: Induced for GDM, [facility] [place]  3 Term 11/10/01   8 lb (3.629 kg) M Vag-Spont  N Y     Comments: Induced for PPROM  2 TAB           1 TAB             Obstetric Comments  Induced, gest diab- diet controlled   Past Medical History  Diagnosis Date  . Gestational diabetes   . Vaginal Pap smear, abnormal     colpo, ok since   Past Surgical History  Procedure Laterality Date  . Induced abortion      x2  . Colposcopy     Family History  Problem Relation Age of Onset  . Diabetes Mother   . Alcohol abuse Father   . Diabetes Sister   . Diabetes Maternal Grandmother   . Hypertension Maternal Grandmother   . Stroke Maternal Grandmother      Exam    Uterus:     Pelvic Exam:    Perineum:    Vulva:    Vagina:     pH:    Cervix:    Adnexa:    Bony Pelvis:   System: Breast:     Skin: normal coloration and turgor, no rashes    Neurologic: oriented, normal   Extremities: normal strength, tone, and muscle mass   HEENT    Mouth/Teeth mucous membranes moist, pharynx normal without lesions   Neck supple and no masses   Cardiovascular: regular rate and rhythm   Respiratory:  appears well, vitals normal, no respiratory distress, acyanotic, normal RR, ear and throat exam is normal, neck free of mass or lymphadenopathy, chest clear, no  wheezing, crepitations, rhonchi, normal symmetric air entry   Abdomen: soft, non-tender; bowel sounds normal; no masses,  no organomegaly   Urinary:       Assessment:    Pregnancy: [id number] Patient Active Problem List   Diagnosis Date Noted  . Uncertain dates, antepartum 01/01/2015        Plan:     Initial labs drawn. Prenatal vitamins. Problem list reviewed and updated. Genetic Screening discussed Quad Screen: requested.  Ultrasound discussed; fetal survey: results reviewed.  Follow up in 4 weeks. 50% of 30 min visit spent on counseling and coordination of care.  Prenatal Labs and Quad screen today   MedtronicClemmons,Lori Grissett 01/01/2015

## 2015-01-01 NOTE — Progress Notes (Signed)
Here for first visit. States did not take zithromax for suspected bronchitis. States feels better now so did not take it. Given prenatal education booklets. Declines flu shot. States had pap done about a year ago.

## 2015-01-01 NOTE — Patient Instructions (Addendum)
Second Trimester of Pregnancy The second trimester is from week 13 through week 28, months 4 through 6. The second trimester is often a time when you feel your best. Your body has also adjusted to being pregnant, and you begin to feel better physically. Usually, morning sickness has lessened or quit completely, you may have more energy, and you may have an increase in appetite. The second trimester is also a time when the fetus is growing rapidly. At the end of the sixth month, the fetus is about 9 inches long and weighs about 1 pounds. You will likely begin to feel the baby move (quickening) between 18 and 20 weeks of the pregnancy. BODY CHANGES Your body goes through many changes during pregnancy. The changes vary from woman to woman.   Your weight will continue to increase. You will notice your lower abdomen bulging out.  You may begin to get stretch marks on your hips, abdomen, and breasts.  You may develop headaches that can be relieved by medicines approved by your health care provider.  You may urinate more often because the fetus is pressing on your bladder.  You may develop or continue to have heartburn as a result of your pregnancy.  You may develop constipation because certain hormones are causing the muscles that push waste through your intestines to slow down.  You may develop hemorrhoids or swollen, bulging veins (varicose veins).  You may have back pain because of the weight gain and pregnancy hormones relaxing your joints between the bones in your pelvis and as a result of a shift in weight and the muscles that support your balance.  Your breasts will continue to grow and be tender.  Your gums may bleed and may be sensitive to brushing and flossing.  Dark spots or blotches (chloasma, mask of pregnancy) may develop on your face. This will likely fade after the baby is born.  A dark line from your belly button to the pubic area (linea nigra) may appear. This will likely fade  after the baby is born.  You may have changes in your hair. These can include thickening of your hair, rapid growth, and changes in texture. Some women also have hair loss during or after pregnancy, or hair that feels dry or thin. Your hair will most likely return to normal after your baby is born. WHAT TO EXPECT AT YOUR PRENATAL VISITS During a routine prenatal visit:  You will be weighed to make sure you and the fetus are growing normally.  Your blood pressure will be taken.  Your abdomen will be measured to track your baby's growth.  The fetal heartbeat will be listened to.  Any test results from the previous visit will be discussed. Your health care provider may ask you:  How you are feeling.  If you are feeling the baby move.  If you have had any abnormal symptoms, such as leaking fluid, bleeding, severe headaches, or abdominal cramping.  If you are using any tobacco products, including cigarettes, chewing tobacco, and electronic cigarettes.  If you have any questions. Other tests that may be performed during your second trimester include:  Blood tests that check for:  Low iron levels (anemia).  Gestational diabetes (between 24 and 28 weeks).  Rh antibodies.  Urine tests to check for infections, diabetes, or protein in the urine.  An ultrasound to confirm the proper growth and development of the baby.  An amniocentesis to check for possible genetic problems.  Fetal screens for spina bifida   and Down syndrome.  HIV (human immunodeficiency virus) testing. Routine prenatal testing includes screening for HIV, unless you choose not to have this test. HOME CARE INSTRUCTIONS   Avoid all smoking, herbs, alcohol, and unprescribed drugs. These chemicals affect the formation and growth of the baby.  Do not use any tobacco products, including cigarettes, chewing tobacco, and electronic cigarettes. If you need help quitting, ask your health care provider. You may receive  counseling support and other resources to help you quit.  Follow your health care provider's instructions regarding medicine use. There are medicines that are either safe or unsafe to take during pregnancy.  Exercise only as directed by your health care provider. Experiencing uterine cramps is a good sign to stop exercising.  Continue to eat regular, healthy meals.  Wear a good support bra for breast tenderness.  Do not use hot tubs, steam rooms, or saunas.  Wear your seat belt at all times when driving.  Avoid raw meat, uncooked cheese, cat litter boxes, and soil used by cats. These carry germs that can cause birth defects in the baby.  Take your prenatal vitamins.  Take 1500-2000 mg of calcium daily starting at the 20th week of pregnancy until you deliver your baby.  Try taking a stool softener (if your health care provider approves) if you develop constipation. Eat more high-fiber foods, such as fresh vegetables or fruit and whole grains. Drink plenty of fluids to keep your urine clear or pale yellow.  Take warm sitz baths to soothe any pain or discomfort caused by hemorrhoids. Use hemorrhoid cream if your health care provider approves.  If you develop varicose veins, wear support hose. Elevate your feet for 15 minutes, 3-4 times a day. Limit salt in your diet.  Avoid heavy lifting, wear low heel shoes, and practice good posture.  Rest with your legs elevated if you have leg cramps or low back pain.  Visit your dentist if you have not gone yet during your pregnancy. Use a soft toothbrush to brush your teeth and be gentle when you floss.  A sexual relationship may be continued unless your health care provider directs you otherwise.  Continue to go to all your prenatal visits as directed by your health care provider. SEEK MEDICAL CARE IF:   You have dizziness.  You have mild pelvic cramps, pelvic pressure, or nagging pain in the abdominal area.  You have persistent nausea,  vomiting, or diarrhea.  You have a bad smelling vaginal discharge.  You have pain with urination. SEEK IMMEDIATE MEDICAL CARE IF:   You have a fever.  You are leaking fluid from your vagina.  You have spotting or bleeding from your vagina.  You have severe abdominal cramping or pain.  You have rapid weight gain or loss.  You have shortness of breath with chest pain.  You notice sudden or extreme swelling of your face, hands, ankles, feet, or legs.  You have not felt your baby move in over an hour.  You have severe headaches that do not go away with medicine.  You have vision changes.   This information is not intended to replace advice given to you by your health care provider. Make sure you discuss any questions you have with your health care provider.   Document Released: 02/10/2001 Document Revised: 03/09/2014 Document Reviewed: 04/19/2012 Elsevier Interactive Patient Education 2016 [employer]  HOME CARE INSTRUCTIONS  Medicines  Follow your health care provider's instructions regarding medicine use. Specific medicines may be either safe  or unsafe to take during pregnancy.  Take your prenatal vitamins as directed.  If you develop constipation, try taking a stool softener if your health care provider approves. Diet  Eat regular, well-balanced meals. Choose a variety of foods, such as meat or vegetable-based protein, fish, milk and low-fat dairy products, vegetables, fruits, and whole grain breads and cereals. Your health care provider will help you determine the amount of weight gain that is right for you.  Avoid raw meat and uncooked cheese. These carry germs that can cause birth defects in the baby.  Eating four or five small meals rather than three large meals a day may help relieve nausea and vomiting. If you start to feel nauseous, eating a few soda crackers can be helpful. Drinking liquids between meals instead of during meals also seems to help nausea and  vomiting.  If you develop constipation, eat more high-fiber foods, such as fresh vegetables or fruit and whole grains. Drink enough fluids to keep your urine clear or pale yellow. Activity and Exercise  Exercise only as directed by your health care provider. Exercising will help you:  Control your weight.  Stay in shape.  Be prepared for labor and delivery.  Experiencing pain or cramping in the lower abdomen or low back is a good sign that you should stop exercising. Check with your health care provider before continuing normal exercises.  Try to avoid standing for long periods of time. Move your legs often if you must stand in one place for a long time.  Avoid heavy lifting.  Wear low-heeled shoes, and practice good posture.  You may continue to have sex unless your health care provider directs you otherwise. Relief of Pain or Discomfort  Wear a good support bra for breast tenderness.   Take warm sitz baths to soothe any pain or discomfort caused by hemorrhoids. Use hemorrhoid cream if your health care provider approves.   Rest with your legs elevated if you have leg cramps or low back pain.  If you develop varicose veins in your legs, wear support hose. Elevate your feet for 15 minutes, 3-4 times a day. Limit salt in your diet. Prenatal Care  Schedule your prenatal visits by the twelfth week of pregnancy. They are usually scheduled monthly at first, then more often in the last 2 months before delivery.  Write down your questions. Take them to your prenatal visits.  Keep all your prenatal visits as directed by your health care provider. Safety  Wear your seat belt at all times when driving.  Make a list of emergency phone numbers, including numbers for family, friends, the hospital, and police and fire departments. General Tips  Ask your health care provider for a referral to a local prenatal education class. Begin classes no later than at the beginning of month 6 of  your pregnancy.  Ask for help if you have counseling or nutritional needs during pregnancy. Your health care provider can offer advice or refer you to specialists for help with various needs.  Do not use hot tubs, steam rooms, or saunas.  Do not douche or use tampons or scented sanitary pads.  Do not cross your legs for long periods of time.  Avoid cat litter boxes and soil used by cats. These carry germs that can cause birth defects in the baby and possibly loss of the fetus by miscarriage or stillbirth.  Avoid all smoking, herbs, alcohol, and medicines not prescribed by your health care provider. Chemicals in these affect  the formation and growth of the baby.  Do not use any tobacco products, including cigarettes, chewing tobacco, and electronic cigarettes. If you need help quitting, ask your health care provider. You may receive counseling support and other resources to help you quit.  Schedule a dentist appointment. At home, brush your teeth with a soft toothbrush and be gentle when you floss. SEEK MEDICAL CARE IF:   You have dizziness.  You have mild pelvic cramps, pelvic pressure, or nagging pain in the abdominal area.  You have persistent nausea, vomiting, or diarrhea.  You have a bad smelling vaginal discharge.  You have pain with urination.  You notice increased swelling in your face, hands, legs, or ankles. SEEK IMMEDIATE MEDICAL CARE IF:   You have a fever.  You are leaking fluid from your vagina.  You have spotting or bleeding from your vagina.  You have severe abdominal cramping or pain.  You have rapid weight gain or loss.  You vomit blood or material that looks like coffee grounds.  You are exposed to [place] measles and have never had them.  You are exposed to fifth disease or chickenpox.  You develop a severe headache.  You have shortness of breath.  You have any kind of trauma, such as from a fall or a car accident.   This information is not  intended to replace advice given to you by your health care provider. Make sure you discuss any questions you have with your health care provider.   Document Released: 02/10/2001 Document Revised: 03/09/2014 Document Reviewed: 12/27/2012 Elsevier Interactive Patient Education 2016 [employer].  Gestational Diabetes Mellitus Gestational diabetes mellitus, often simply referred to as gestational diabetes, is a type of diabetes that some women develop during pregnancy. In gestational diabetes, the pancreas does not make enough insulin (a hormone), the cells are less responsive to the insulin that is made (insulin resistance), or both.Normally, insulin moves sugars from food into the tissue cells. The tissue cells use the sugars for energy. The lack of insulin or the lack of normal response to insulin causes excess sugars to build up in the blood instead of going into the tissue cells. As a result, high blood sugar (hyperglycemia) develops. The effect of high sugar (glucose) levels can cause many problems.  RISK FACTORS You have an increased chance of developing gestational diabetes if you have a family history of diabetes and also have one or more of the following risk factors:  A body mass index over 30 (obesity).  A previous pregnancy with gestational diabetes.  An older age at the time of pregnancy. If blood glucose levels are kept in the normal range during pregnancy, women can have a healthy pregnancy. If your blood glucose levels are not well controlled, there may be risks to you, your unborn baby (fetus), your labor and delivery, or your newborn baby.  SYMPTOMS  If symptoms are experienced, they are much like symptoms you would normally expect during pregnancy. The symptoms of gestational diabetes include:   Increased thirst (polydipsia).  Increased urination (polyuria).  Increased urination during the night (nocturia).  Weight loss. This weight loss may be rapid.  Frequent,  recurring infections.  Tiredness (fatigue).  Weakness.  Vision changes, such as blurred vision.  Fruity smell to your breath.  Abdominal pain. DIAGNOSIS Diabetes is diagnosed when blood glucose levels are increased. Your blood glucose level may be checked by one or more of the following blood tests:  A fasting blood glucose test. You will  not be allowed to eat for at least 8 hours before a blood sample is taken.  A random blood glucose test. Your blood glucose is checked at any time of the day regardless of when you ate.  An oral glucose tolerance test (OGTT). Your blood glucose is measured after you have not eaten (fasted) for 1-3 hours and then after you drink a glucose-containing beverage. Since the hormones that cause insulin resistance are highest at about 24-28 weeks of a pregnancy, an OGTT is usually performed during that time. If you have risk factors, you may be screened for undiagnosed type 2 diabetes at your first prenatal visit. TREATMENT  Gestational diabetes should be managed first with diet and exercise. Medicines may be added only if they are needed.  You will need to take diabetes medicine or insulin daily to keep blood glucose levels in the desired range.  You will need to match insulin dosing with exercise and healthy food choices. If you have gestational diabetes, your treatment goal is to maintain the following blood glucose levels:  Before meals (preprandial): at or below 95 mg/dL.  After meals (postprandial):  One hour after a meal: at or below 140 mg/dL.  Two hours after a meal: at or below 120 mg/dL. If you have pre-existing type 1 or type 2 diabetes, your treatment goal is to maintain the following blood glucose levels:  Before meals, at bedtime, and overnight: 60-99 mg/dL.  After meals: peak of 100-129 mg/dL. HOME CARE INSTRUCTIONS   Have your hemoglobin A1c level checked twice a year.  Perform daily blood glucose monitoring as directed by your  health care provider. It is common to perform frequent blood glucose monitoring.  Monitor urine ketones when you are ill and as directed by your health care provider.  Take your diabetes medicine and insulin as directed by your health care provider to maintain your blood glucose level in the desired range.  Never run out of diabetes medicine or insulin. It is needed every day.  Adjust insulin based on your intake of carbohydrates. Carbohydrates can raise blood glucose levels but need to be included in your diet. Carbohydrates provide vitamins, minerals, and fiber which are an essential part of a healthy diet. Carbohydrates are found in fruits, vegetables, whole grains, dairy products, legumes, and foods containing added sugars.  Eat healthy foods. Alternate 3 meals with 3 snacks.  Maintain a healthy weight gain. The usual total expected weight gain varies according to your prepregnancy body mass index (BMI).  Carry a medical alert card or wear your medical alert jewelry.  Carry a 15-gram carbohydrate snack with you at all times to treat low blood glucose (hypoglycemia). Some examples of 15-gram carbohydrate snacks include:  Glucose tablets, 3 or 4.  Glucose gel, 15-gram tube.  Raisins, 2 tablespoons (24 g).  Jelly beans, 6.  Animal crackers, 8.  Fruit juice, regular soda, or low-fat milk, 4 ounces (120 mL).  Gummy treats, 9.  Recognize hypoglycemia. Hypoglycemia during pregnancy occurs with blood glucose levels of 60 mg/dL and below. The risk for hypoglycemia increases when fasting or skipping meals, during or after intense exercise, and during sleep. Hypoglycemia symptoms can include:  Tremors or shakes.  Decreased ability to concentrate.  Sweating.  Increased heart rate.  Headache.  Dry mouth.  Hunger.  Irritability.  Anxiety.  Restless sleep.  Altered speech or coordination.  Confusion.  Treat hypoglycemia promptly. If you are alert and able to safely  swallow, follow the 15:15 rule:  Take  15-20 grams of rapid-acting glucose or carbohydrate. Rapid-acting options include glucose gel, glucose tablets, or 4 ounces (120 mL) of fruit juice, regular soda, or low-fat milk.  Check your blood glucose level 15 minutes after taking the glucose.  Take 15-20 grams more of glucose if the repeat blood glucose level is still 70 mg/dL or below.  Eat a meal or snack within 1 hour once blood glucose levels return to normal.  Be alert to polyuria (excess urination) and polydipsia (excess thirst) which are early signs of hyperglycemia. An early awareness of hyperglycemia allows for prompt treatment. Treat hyperglycemia as directed by your health care provider.  Engage in at least 30 minutes of physical activity a day or as directed by your health care provider. Ten minutes of physical activity timed 30 minutes after each meal is encouraged to control postprandial blood glucose levels.  Adjust your insulin dosing and food intake as needed if you start a new exercise or sport.  Follow your sick-day plan at any time you are unable to eat or drink as usual.  Avoid tobacco and alcohol use.  Keep all follow-up visits as directed by your health care provider.  Follow the advice of your health care provider regarding your prenatal and post-delivery (postpartum) appointments, meal planning, exercise, medicines, vitamins, blood tests, other medical tests, and physical activities.  Perform daily skin and foot care. Examine your skin and feet daily for cuts, bruises, redness, nail problems, bleeding, blisters, or sores.  Brush your teeth and gums at least twice a day and floss at least once a day. Follow up with your dentist regularly.  Schedule an eye exam during the first trimester of your pregnancy or as directed by your health care provider.  Share your diabetes management plan with your workplace or school.  Stay up-to-date with immunizations.  Learn to  manage stress.  Obtain ongoing diabetes education and support as needed.  Learn about and consider breastfeeding your baby.  You should have your blood sugar level checked 6-12 weeks after delivery. This is done with an oral glucose tolerance test (OGTT). SEEK MEDICAL CARE IF:   You are unable to eat food or drink fluids for more than 6 hours.  You have nausea and vomiting for more than 6 hours.  You have a blood glucose level of 200 mg/dL and you have ketones in your urine.  There is a change in mental status.  You develop vision problems.  You have a persistent headache.  You have upper abdominal pain or discomfort.  You develop an additional serious illness.  You have diarrhea for more than 6 hours.  You have been sick or have had a fever for a couple of days and are not getting better. SEEK IMMEDIATE MEDICAL CARE IF:   You have difficulty breathing.  You no longer feel the baby moving.  You are bleeding or have discharge from your vagina.  You start having premature contractions or labor. MAKE SURE YOU:  Understand these instructions.  Will watch your condition.  Will get help right away if you are not doing well or get worse.   This information is not intended to replace advice given to you by your health care provider. Make sure you discuss any questions you have with your health care provider.   Document Released: 05/25/2000 Document Revised: 03/09/2014 Document Reviewed: 09/15/2011 Elsevier Interactive Patient Education 2016 [employer].  Pregnancy and Smoking Smoking during pregnancy is unhealthy for you and your developing baby. The addictive  drug nicotine, carbon monoxide, and many other poisons are inhaled from a cigarette and carried through your bloodstream to your baby. Cigarette smoke contains more than 2,500 chemicals. It is not known which of these are harmful to a developing baby. However, both nicotine and carbon monoxide play a role in causing  health problems in pregnancy. Smoking during pregnancy increases the risk of:  Birth defects in your baby, including heart defects.  Miscarriage and stillbirth.  Birth before 37 completed weeks of pregnancy (premature birth).  Pregnancy outside of the uterus (tubal pregnancy).  Attachment of the placenta over the opening of the uterus (placenta previa).  Detachment of the placenta before the baby's birth (placental abruption).  Breaking of the bag of waters before labor begins (premature rupture of membranes). HOW DOES SMOKING DURING PREGNANCY AFFECT MY BABY? Before Birth Smoking during pregnancy:  Decreases blood flow and oxygen to your baby.  Increases the heart rate of your baby.  Slows your baby's growth in the uterus (intrauterine growth retardation). After Birth Babies born to women who smoke during pregnancy are more likely to have a low birth weight. They are also at risk for:  Serious health problems, chronic or lifelong disabilities (cerebral palsy, mental retardation, learning problems), and death.  Sudden infant death syndrome (SIDS).  Lung and breathing problems. WHAT RESOURCES ARE AVAILABLE TO HELP ME STOP SMOKING?  Ask your health care provider for help to stop smoking. The following resources are available:  Counseling.  Psychological treatment.  Acupuncture.  Family intervention.  Hypnosis.  Nicotine supplements have not been studied enough to know if they are safe to use during pregnancy. They should only be considered when all other methods fail, and if used under the close supervision of your health care provider.  Telephone QUIT lines. The national smoking cessation telephone hotline number is 1-800-QUIT NOW. FOR MORE INFORMATION  American Cancer Society: www.cancer.org  American Heart Association: www.heart.org  National Cancer Institute: www.cancer.gov  March of Dimes: www.marchofdimes.org   This information is not intended to replace  advice given to you by your health care provider. Make sure you discuss any questions you have with your health care provider.   Document Released: 06/30/2004 Document Revised: 02/21/2013 Document Reviewed: 01/16/2013 Elsevier Interactive Patient Education 2016 [employer]. Second Trimester of Pregnancy The second trimester is from week 13 through week 28, months 4 through 6. The second trimester is often a time when you feel your best. Your body has also adjusted to being pregnant, and you begin to feel better physically. Usually, morning sickness has lessened or quit completely, you may have more energy, and you may have an increase in appetite. The second trimester is also a time when the fetus is growing rapidly. At the end of the sixth month, the fetus is about 9 inches long and weighs about 1 pounds. You will likely begin to feel the baby move (quickening) between 18 and 20 weeks of the pregnancy. BODY CHANGES Your body goes through many changes during pregnancy. The changes vary from woman to woman.   Your weight will continue to increase. You will notice your lower abdomen bulging out.  You may begin to get stretch marks on your hips, abdomen, and breasts.  You may develop headaches that can be relieved by medicines approved by your health care provider.  You may urinate more often because the fetus is pressing on your bladder.  You may develop or continue to have heartburn as a result of your pregnancy.  You may develop constipation because certain hormones are causing the muscles that push waste through your intestines to slow down.  You may develop hemorrhoids or swollen, bulging veins (varicose veins).  You may have back pain because of the weight gain and pregnancy hormones relaxing your joints between the bones in your pelvis and as a result of a shift in weight and the muscles that support your balance.  Your breasts will continue to grow and be tender.  Your gums may  bleed and may be sensitive to brushing and flossing.  Dark spots or blotches (chloasma, mask of pregnancy) may develop on your face. This will likely fade after the baby is born.  A dark line from your belly button to the pubic area (linea nigra) may appear. This will likely fade after the baby is born.  You may have changes in your hair. These can include thickening of your hair, rapid growth, and changes in texture. Some women also have hair loss during or after pregnancy, or hair that feels dry or thin. Your hair will most likely return to normal after your baby is born. WHAT TO EXPECT AT YOUR PRENATAL VISITS During a routine prenatal visit:  You will be weighed to make sure you and the fetus are growing normally.  Your blood pressure will be taken.  Your abdomen will be measured to track your baby's growth.  The fetal heartbeat will be listened to.  Any test results from the previous visit will be discussed. Your health care provider may ask you:  How you are feeling.  If you are feeling the baby move.  If you have had any abnormal symptoms, such as leaking fluid, bleeding, severe headaches, or abdominal cramping.  If you are using any tobacco products, including cigarettes, chewing tobacco, and electronic cigarettes.  If you have any questions. Other tests that may be performed during your second trimester include:  Blood tests that check for:  Low iron levels (anemia).  Gestational diabetes (between 24 and 28 weeks).  Rh antibodies.  Urine tests to check for infections, diabetes, or protein in the urine.  An ultrasound to confirm the proper growth and development of the baby.  An amniocentesis to check for possible genetic problems.  Fetal screens for spina bifida and Down syndrome.  HIV (human immunodeficiency virus) testing. Routine prenatal testing includes screening for HIV, unless you choose not to have this test. HOME CARE INSTRUCTIONS   Avoid all  smoking, herbs, alcohol, and unprescribed drugs. These chemicals affect the formation and growth of the baby.  Do not use any tobacco products, including cigarettes, chewing tobacco, and electronic cigarettes. If you need help quitting, ask your health care provider. You may receive counseling support and other resources to help you quit.  Follow your health care provider's instructions regarding medicine use. There are medicines that are either safe or unsafe to take during pregnancy.  Exercise only as directed by your health care provider. Experiencing uterine cramps is a good sign to stop exercising.  Continue to eat regular, healthy meals.  Wear a good support bra for breast tenderness.  Do not use hot tubs, steam rooms, or saunas.  Wear your seat belt at all times when driving.  Avoid raw meat, uncooked cheese, cat litter boxes, and soil used by cats. These carry germs that can cause birth defects in the baby.  Take your prenatal vitamins.  Take 1500-2000 mg of calcium daily starting at the 20th week of pregnancy until you deliver  your baby.  Try taking a stool softener (if your health care provider approves) if you develop constipation. Eat more high-fiber foods, such as fresh vegetables or fruit and whole grains. Drink plenty of fluids to keep your urine clear or pale yellow.  Take warm sitz baths to soothe any pain or discomfort caused by hemorrhoids. Use hemorrhoid cream if your health care provider approves.  If you develop varicose veins, wear support hose. Elevate your feet for 15 minutes, 3-4 times a day. Limit salt in your diet.  Avoid heavy lifting, wear low heel shoes, and practice good posture.  Rest with your legs elevated if you have leg cramps or low back pain.  Visit your dentist if you have not gone yet during your pregnancy. Use a soft toothbrush to brush your teeth and be gentle when you floss.  A sexual relationship may be continued unless your health care  provider directs you otherwise.  Continue to go to all your prenatal visits as directed by your health care provider. SEEK MEDICAL CARE IF:   You have dizziness.  You have mild pelvic cramps, pelvic pressure, or nagging pain in the abdominal area.  You have persistent nausea, vomiting, or diarrhea.  You have a bad smelling vaginal discharge.  You have pain with urination. SEEK IMMEDIATE MEDICAL CARE IF:   You have a fever.  You are leaking fluid from your vagina.  You have spotting or bleeding from your vagina.  You have severe abdominal cramping or pain.  You have rapid weight gain or loss.  You have shortness of breath with chest pain.  You notice sudden or extreme swelling of your face, hands, ankles, feet, or legs.  You have not felt your baby move in over an hour.  You have severe headaches that do not go away with medicine.  You have vision changes.   This information is not intended to replace advice given to you by your health care provider. Make sure you discuss any questions you have with your health care provider.   Document Released: 02/10/2001 Document Revised: 03/09/2014 Document Reviewed: 04/19/2012 Elsevier Interactive Patient Education [employer]

## 2015-01-01 NOTE — Addendum Note (Signed)
Addended by: Gerome ApleyZEYFANG, Anaalicia Reimann L on: 01/01/2015 01:54 PM   Modules accepted: Orders

## 2015-01-02 LAB — PRESCRIPTION MONITORING PROFILE (19 PANEL)
Amphetamine/Meth: NEGATIVE ng/mL
Barbiturate Screen, Urine: NEGATIVE ng/mL
Benzodiazepine Screen, Urine: NEGATIVE ng/mL
Buprenorphine, Urine: NEGATIVE ng/mL
Cannabinoid Scrn, Ur: NEGATIVE ng/mL
Carisoprodol, Urine: NEGATIVE ng/mL
Cocaine Metabolites: NEGATIVE ng/mL
Creatinine, Urine: 241.37 mg/dL (ref >20.0)
Fentanyl, Ur: NEGATIVE ng/mL
MDMA URINE: NEGATIVE ng/mL
Meperidine, Ur: NEGATIVE ng/mL
Methadone Screen, Urine: NEGATIVE ng/mL
Methaqualone: NEGATIVE ng/mL
Nitrites, Initial: NEGATIVE ug/mL
Opiate Screen, Urine: NEGATIVE ng/mL
Oxycodone Screen, Ur: NEGATIVE ng/mL
Phencyclidine, Ur: NEGATIVE ng/mL
Propoxyphene: NEGATIVE ng/mL
Tapentadol, urine: NEGATIVE ng/mL
Tramadol Scrn, Ur: NEGATIVE ng/mL
Zolpidem, Urine: NEGATIVE ng/mL
pH, Initial: 6.3 pH (ref 4.5–8.9)

## 2015-01-02 LAB — GLUCOSE TOLERANCE, 1 HOUR (50G) W/O FASTING: Glucose, 1 Hour GTT: 94 mg/dL (ref 70–140)

## 2015-01-03 LAB — CULTURE, OB URINE
Colony Count: NO GROWTH
Organism ID, Bacteria: NO GROWTH

## 2015-01-03 LAB — HEMOGLOBINOPATHY EVALUATION
Hemoglobin Other: 0 %
Hgb A2 Quant: 2.7 % (ref 2.2–3.2)
Hgb A: 97.3 % (ref 96.8–97.8)
Hgb F Quant: 0 % (ref 0.0–2.0)
Hgb S Quant: 0 %

## 2015-01-04 LAB — AFP, QUAD SCREEN
AFP: 66.8 ng/mL
Age Alone: 1:639 {titer}
Curr Gest Age: 19.4 wks.days
Down Syndrome Scr Risk Est: <1:38500 {titer}
HCG, Total: 15.46 IU/mL
INH: 127.3 pg/mL
Interpretation-AFP: NEGATIVE
MoM for AFP: 1.25
MoM for INH: 0.8
MoM for hCG: 0.76
Open Spina bifida: NEGATIVE
Osb Risk: 1:11700 {titer}
Tri 18 Scr Risk Est: NEGATIVE
Trisomy 18 (Edward) Syndrome Interp.: 1:37600 {titer}
uE3 Mom: 1
uE3 Value: 1.64 ng/mL

## 2015-01-04 LAB — PRENATAL PROFILE (SOLSTAS)
Antibody Screen: NEGATIVE
Basophils Absolute: 0 10*3/uL (ref 0.0–0.1)
Basophils Relative: 0 % (ref 0–1)
Eosinophils Absolute: 0.4 10*3/uL (ref 0.0–0.7)
Eosinophils Relative: 3 % (ref 0–5)
HCT: 34.9 % — ABNORMAL LOW (ref 36.0–46.0)
HIV 1&2 Ab, 4th Generation: NONREACTIVE
Hemoglobin: 11.5 g/dL — ABNORMAL LOW (ref 12.0–15.0)
Hepatitis B Surface Ag: NEGATIVE
Lymphocytes Relative: 20 % (ref 12–46)
Lymphs Abs: 2.4 10*3/uL (ref 0.7–4.0)
MCH: 29.7 pg (ref 26.0–34.0)
MCHC: 33 g/dL (ref 30.0–36.0)
MCV: 90.2 fL (ref 78.0–100.0)
MPV: 10.3 fL (ref 8.6–12.4)
Monocytes Absolute: 0.7 10*3/uL (ref 0.1–1.0)
Monocytes Relative: 6 % (ref 3–12)
Neutro Abs: 8.4 10*3/uL — ABNORMAL HIGH (ref 1.7–7.7)
Neutrophils Relative %: 71 % (ref 43–77)
Platelets: 264 10*3/uL (ref 150–400)
RBC: 3.87 MIL/uL (ref 3.87–5.11)
RDW: 14 % (ref 11.5–15.5)
Rh Type: POSITIVE
Rubella: 1.13 Index — ABNORMAL HIGH (ref <0.90)
WBC: 11.8 10*3/uL — ABNORMAL HIGH (ref 4.0–10.5)

## 2015-01-16 ENCOUNTER — Encounter: Payer: Self-pay | Admitting: Family Medicine

## 2015-01-28 ENCOUNTER — Encounter: Payer: Self-pay | Admitting: *Deleted

## 2015-01-29 ENCOUNTER — Encounter: Payer: Self-pay | Admitting: Student

## 2015-02-05 ENCOUNTER — Telehealth: Payer: Self-pay | Admitting: *Deleted

## 2015-02-05 ENCOUNTER — Ambulatory Visit (INDEPENDENT_AMBULATORY_CARE_PROVIDER_SITE_OTHER): Payer: Medicaid Other | Admitting: Student

## 2015-02-05 ENCOUNTER — Encounter: Payer: Self-pay | Admitting: Student

## 2015-02-05 VITALS — BP 106/54 | HR 102 | Temp 98.6°F | Wt 187.7 lb

## 2015-02-05 DIAGNOSIS — Z0489 Encounter for examination and observation for other specified reasons: Secondary | ICD-10-CM

## 2015-02-05 DIAGNOSIS — Z3492 Encounter for supervision of normal pregnancy, unspecified, second trimester: Secondary | ICD-10-CM

## 2015-02-05 DIAGNOSIS — IMO0002 Reserved for concepts with insufficient information to code with codable children: Secondary | ICD-10-CM

## 2015-02-05 LAB — POCT URINALYSIS DIP (DEVICE)
Bilirubin Urine: NEGATIVE
GLUCOSE, UA: NEGATIVE mg/dL
Ketones, ur: NEGATIVE mg/dL
NITRITE: NEGATIVE
PROTEIN: NEGATIVE mg/dL
Specific Gravity, Urine: 1.02 (ref 1.005–1.030)
Urobilinogen, UA: 0.2 mg/dL (ref 0.0–1.0)
pH: 7 (ref 5.0–8.0)

## 2015-02-05 NOTE — Progress Notes (Signed)
Urine: small amt wbc, trace hgb Breastfeeding tip of the week reviewed

## 2015-02-05 NOTE — Patient Instructions (Signed)
Fetal Movement Counts Patient Name: __________________________________________________ Patient Due Date: ____________________ Performing a fetal movement count is highly recommended in high-risk pregnancies, but it is good for every pregnant woman to do. Your health care provider may ask you to start counting fetal movements at 28 weeks of the pregnancy. Fetal movements often increase:  After eating a full meal.  After physical activity.  After eating or drinking something sweet or cold.  At rest. Pay attention to when you feel the baby is most active. This will help you notice a pattern of your baby's sleep and wake cycles and what factors contribute to an increase in fetal movement. It is important to perform a fetal movement count at the same time each day when your baby is normally most active.  HOW TO COUNT FETAL MOVEMENTS 1. Find a quiet and comfortable area to sit or lie down on your left side. Lying on your left side provides the best blood and oxygen circulation to your baby. 2. Write down the day and time on a sheet of paper or in a journal. 3. Start counting kicks, flutters, swishes, rolls, or jabs in a 2-hour period. You should feel at least 10 movements within 2 hours. 4. If you do not feel 10 movements in 2 hours, wait 2-3 hours and count again. Look for a change in the pattern or not enough counts in 2 hours. SEEK MEDICAL CARE IF:  You feel less than 10 counts in 2 hours, tried twice.  There is no movement in over an hour.  The pattern is changing or taking longer each day to reach 10 counts in 2 hours.  You feel the baby is not moving as he or she usually does. Date: ____________ Movements: ____________ Start time: ____________ Finish time: ____________  Date: ____________ Movements: ____________ Start time: ____________ Finish time: ____________ Date: ____________ Movements: ____________ Start time: ____________ Finish time: ____________ Date: ____________ Movements:  ____________ Start time: ____________ Finish time: ____________ Date: ____________ Movements: ____________ Start time: ____________ Finish time: ____________ Date: ____________ Movements: ____________ Start time: ____________ Finish time: ____________ Date: ____________ Movements: ____________ Start time: ____________ Finish time: ____________ Date: ____________ Movements: ____________ Start time: ____________ Finish time: ____________  Date: ____________ Movements: ____________ Start time: ____________ Finish time: ____________ Date: ____________ Movements: ____________ Start time: ____________ Finish time: ____________ Date: ____________ Movements: ____________ Start time: ____________ Finish time: ____________ Date: ____________ Movements: ____________ Start time: ____________ Finish time: ____________ Date: ____________ Movements: ____________ Start time: ____________ Finish time: ____________ Date: ____________ Movements: ____________ Start time: ____________ Finish time: ____________ Date: ____________ Movements: ____________ Start time: ____________ Finish time: ____________  Date: ____________ Movements: ____________ Start time: ____________ Finish time: ____________ Date: ____________ Movements: ____________ Start time: ____________ Finish time: ____________ Date: ____________ Movements: ____________ Start time: ____________ Finish time: ____________ Date: ____________ Movements: ____________ Start time: ____________ Finish time: ____________ Date: ____________ Movements: ____________ Start time: ____________ Finish time: ____________ Date: ____________ Movements: ____________ Start time: ____________ Finish time: ____________ Date: ____________ Movements: ____________ Start time: ____________ Finish time: ____________  Date: ____________ Movements: ____________ Start time: ____________ Finish time: ____________ Date: ____________ Movements: ____________ Start time: ____________ Finish  time: ____________ Date: ____________ Movements: ____________ Start time: ____________ Finish time: ____________ Date: ____________ Movements: ____________ Start time: ____________ Finish time: ____________ Date: ____________ Movements: ____________ Start time: ____________ Finish time: ____________ Date: ____________ Movements: ____________ Start time: ____________ Finish time: ____________ Date: ____________ Movements: ____________ Start time: ____________ Finish time: ____________  Date: ____________ Movements: ____________ Start time: ____________ Finish   time: ____________ Date: ____________ Movements: ____________ Start time: ____________ Finish time: ____________ Date: ____________ Movements: ____________ Start time: ____________ Finish time: ____________ Date: ____________ Movements: ____________ Start time: ____________ Finish time: ____________ Date: ____________ Movements: ____________ Start time: ____________ Finish time: ____________ Date: ____________ Movements: ____________ Start time: ____________ Finish time: ____________ Date: ____________ Movements: ____________ Start time: ____________ Finish time: ____________  Date: ____________ Movements: ____________ Start time: ____________ Finish time: ____________ Date: ____________ Movements: ____________ Start time: ____________ Finish time: ____________ Date: ____________ Movements: ____________ Start time: ____________ Finish time: ____________ Date: ____________ Movements: ____________ Start time: ____________ Finish time: ____________ Date: ____________ Movements: ____________ Start time: ____________ Finish time: ____________ Date: ____________ Movements: ____________ Start time: ____________ Finish time: ____________ Date: ____________ Movements: ____________ Start time: ____________ Finish time: ____________  Date: ____________ Movements: ____________ Start time: ____________ Finish time: ____________ Date: ____________  Movements: ____________ Start time: ____________ Finish time: ____________ Date: ____________ Movements: ____________ Start time: ____________ Finish time: ____________ Date: ____________ Movements: ____________ Start time: ____________ Finish time: ____________ Date: ____________ Movements: ____________ Start time: ____________ Finish time: ____________ Date: ____________ Movements: ____________ Start time: ____________ Finish time: ____________ Date: ____________ Movements: ____________ Start time: ____________ Finish time: ____________  Date: ____________ Movements: ____________ Start time: ____________ Finish time: ____________ Date: ____________ Movements: ____________ Start time: ____________ Finish time: ____________ Date: ____________ Movements: ____________ Start time: ____________ Finish time: ____________ Date: ____________ Movements: ____________ Start time: ____________ Finish time: ____________ Date: ____________ Movements: ____________ Start time: ____________ Finish time: ____________ Date: ____________ Movements: ____________ Start time: ____________ Finish time: ____________   This information is not intended to replace advice given to you by your health care provider. Make sure you discuss any questions you have with your health care provider.   Document Released: 03/18/2006 Document Revised: 03/09/2014 Document Reviewed: 12/14/2011 Elsevier Interactive Patient Education 2016 Else[employer] Inc[employer] Preterm Labor Information Preterm labor is when labor starts at less than 37 weeks of pregnancy. The normal length of a pregnancy is 39 to 41 weeks. CAUSES Often, there is no identifiable underlying cause as to why a woman goes into preterm labor. One of the most common known causes of preterm labor is infection. Infections of the uterus, cervix, vagina, amniotic sac, bladder, kidney, or even the lungs (pneumonia) can cause labor to start. Other suspected causes of preterm labor include:    Urogenital infections, such as yeast infections and bacterial vaginosis.   Uterine abnormalities (uterine shape, uterine septum, fibroids, or bleeding from the placenta).   A cervix that has been operated on (it may fail to stay closed).   Malformations in the fetus.   Multiple gestations (twins, triplets, and so on).   Breakage of the amniotic sac.  RISK FACTORS 5. Having a previous history of preterm labor.  6. Having premature rupture of membranes (PROM).  7. Having a placenta that covers the opening of the cervix (placenta previa).  8. Having a placenta that separates from the uterus (placental abruption).  9. Having a cervix that is too weak to hold the fetus in the uterus (incompetent cervix).  10. Having too much fluid in the amniotic sac (polyhydramnios).  11. Taking illegal drugs or smoking while pregnant.  12. Not gaining enough weight while pregnant.  13. Being younger than 18 and older than 30 years old.  14. Having a low socioeconomic status.  15. Being African American. SYMPTOMS Signs and symptoms of preterm labor include:   Menstrual-like cramps, abdominal pain, or back pain.  Uterine contractions that are regular, as   frequent as six in an hour, regardless of their intensity (may be mild or painful).  Contractions that start on the top of the uterus and spread down to the lower abdomen and back.   A sense of increased pelvic pressure.   A watery or bloody mucus discharge that comes from the vagina.  TREATMENT Depending on the length of the pregnancy and other circumstances, your health care provider may suggest bed rest. If necessary, there are medicines that can be given to stop contractions and to mature the fetal lungs. If labor happens before 34 weeks of pregnancy, a prolonged hospital stay may be recommended. Treatment depends on the condition of both you and the fetus.  WHAT SHOULD YOU DO IF YOU THINK YOU ARE IN PRETERM LABOR? Call  your health care provider right away. You will need to go to the hospital to get checked immediately. HOW CAN YOU PREVENT PRETERM LABOR IN FUTURE PREGNANCIES? You should:   Stop smoking if you smoke.  Maintain healthy weight gain and avoid chemicals and drugs that are not necessary.  Be watchful for any type of infection.  Inform your health care provider if you have a known history of preterm labor.   This information is not intended to replace advice given to you by your health care provider. Make sure you discuss any questions you have with your health care provider.   Document Released: 05/09/2003 Document Revised: 10/19/2012 Document Reviewed: 03/21/2012 Elsevier Interactive Patient Education 2016 Else[employer] Inc[employer]  

## 2015-02-05 NOTE — Progress Notes (Signed)
Subjective:  Angela DoffingBarbara Maldonado is a 29 y.o. [phone] at 4238w1d being seen today for ongoing prenatal care.  She is currently monitored for the following issues for this low-risk pregnancy and has Uncertain dates, antepartum on her problem list.  Patient reports no complaints.  Contractions: Not present. Vag. Bleeding: None.  Movement: Present. Denies leaking of fluid.   Per anatomy scan on 10/31, fetus is female. Patient is adamant that she was told she was having a boy. I spoke with ultrasonographer in [facility] who looked through the images and said she could have another ultrasound for better imaging of fetal heart & gender.    The following portions of the patient's history were reviewed and updated as appropriate: allergies, current medications, past family history, past medical history, past social history, past surgical history and problem list. Problem list updated.  Objective:   Filed Vitals:   02/05/15 1025  BP: 106/54  Pulse: 102  Temp: 98.6 F (37 C)  Weight: 187 lb 11.2 oz (85.14 kg)    Fetal Status: Fetal Heart Rate (bpm): 143 Fundal Height: 23 cm Movement: Present     General:  Alert, oriented and cooperative. Patient is in no acute distress.  Skin: Skin is warm and dry. No rash noted.   Cardiovascular: Normal heart rate noted  Respiratory: Normal respiratory effort, no problems with respiration noted  Abdomen: Soft, gravid, appropriate for gestational age. Pain/Pressure: Present     Pelvic: Vag. Bleeding: None     Cervical exam deferred        Extremities: Normal range of motion.  Edema: None  Mental Status: Normal mood and affect. Normal behavior. Normal judgment and thought content.   Urinalysis: Urine Protein: Negative Urine Glucose: Negative  Assessment and Plan:  Pregnancy: [id number] at 1138w1d  There are no diagnoses linked to this encounter. Preterm labor symptoms and general obstetric precautions including but not limited to vaginal bleeding, contractions, leaking of  fluid and fetal movement were reviewed in detail with the patient. Please refer to After Visit Summary for other counseling recommendations.  Return in about 4 weeks (around 03/05/2015).   Judeth HornErin Kiefer Opheim, NP

## 2015-02-05 NOTE — Telephone Encounter (Signed)
Per Judeth HornErin Lawrence, NP scheduled patient for follow up ultrasound on 12/13 @ 1100. Attempted to call patient to let her know and received no answer, voice mail left asking her to call the clinic regarding an appointment that was scheduled for her. She also forgot to check out and schedule her f/u clinic visit today so she needs to schedule her next prenatal visit as well.

## 2015-02-06 NOTE — Telephone Encounter (Signed)
Called patient and informed her of ultrasound appt & next Ob appt. Patient verbalized understanding to all and had no other questions

## 2015-02-12 ENCOUNTER — Ambulatory Visit (HOSPITAL_COMMUNITY)
Admission: RE | Admit: 2015-02-12 | Discharge: 2015-02-12 | Disposition: A | Discharge status: Home / Self Care | Payer: Medicaid Other | Source: Ambulatory Visit | Attending: Student | Admitting: Student

## 2015-02-12 ENCOUNTER — Other Ambulatory Visit: Payer: Self-pay | Admitting: Student

## 2015-02-12 DIAGNOSIS — IMO0002 Reserved for concepts with insufficient information to code with codable children: Secondary | ICD-10-CM

## 2015-02-12 DIAGNOSIS — Z36 Encounter for antenatal screening of mother: Secondary | ICD-10-CM | POA: Diagnosis not present

## 2015-02-12 DIAGNOSIS — Z3A25 25 weeks gestation of pregnancy: Secondary | ICD-10-CM | POA: Insufficient documentation

## 2015-02-12 DIAGNOSIS — Z0489 Encounter for examination and observation for other specified reasons: Secondary | ICD-10-CM

## 2015-03-03 NOTE — L&D Delivery Note (Signed)
Delivery Note Pt pushed well and at 5:50 AM a viable female was delivered via Vaginal, Spontaneous Delivery (Presentation: Right Occiput Anterior).  APGAR: 8, 9; weight 8 lb 6.4 oz (3810 g).  Tight nuchal cord x 2- delivered through using the somersault method. Cord unwound and infant dried and stimulated; placed on pt's abd. Cord clamped and cut by FOB; hospital cord blood sample collected. Placenta status: Intact, Spontaneous.  Cord: 3 vessels   Anesthesia: Epidural  Episiotomy: None Lacerations: None Est. Blood Loss (mL): 200  Mom to postpartum.  Baby to Couplet care / Skin to Skin.  Cam HaiSHAW, Esiah Bazinet CNM 05/23/2015, 6:09 AM

## 2015-03-05 ENCOUNTER — Ambulatory Visit (INDEPENDENT_AMBULATORY_CARE_PROVIDER_SITE_OTHER): Payer: Medicaid Other | Admitting: Advanced Practice Midwife

## 2015-03-05 ENCOUNTER — Encounter: Payer: Self-pay | Admitting: Advanced Practice Midwife

## 2015-03-05 VITALS — BP 110/63 | HR 108 | Temp 98.4°F | Wt 194.3 lb

## 2015-03-05 DIAGNOSIS — R102 Pelvic and perineal pain: Secondary | ICD-10-CM

## 2015-03-05 DIAGNOSIS — O26893 Other specified pregnancy related conditions, third trimester: Secondary | ICD-10-CM

## 2015-03-05 DIAGNOSIS — Z3492 Encounter for supervision of normal pregnancy, unspecified, second trimester: Secondary | ICD-10-CM

## 2015-03-05 DIAGNOSIS — Z3483 Encounter for supervision of other normal pregnancy, third trimester: Secondary | ICD-10-CM

## 2015-03-05 DIAGNOSIS — O09293 Supervision of pregnancy with other poor reproductive or obstetric history, third trimester: Secondary | ICD-10-CM

## 2015-03-05 DIAGNOSIS — Z3480 Encounter for supervision of other normal pregnancy, unspecified trimester: Secondary | ICD-10-CM | POA: Insufficient documentation

## 2015-03-05 DIAGNOSIS — Z8632 Personal history of gestational diabetes: Principal | ICD-10-CM

## 2015-03-05 DIAGNOSIS — N949 Unspecified condition associated with female genital organs and menstrual cycle: Secondary | ICD-10-CM

## 2015-03-05 LAB — POCT URINALYSIS DIP (DEVICE)
BILIRUBIN URINE: NEGATIVE
Glucose, UA: NEGATIVE mg/dL
HGB URINE DIPSTICK: NEGATIVE
Ketones, ur: NEGATIVE mg/dL
NITRITE: NEGATIVE
PH: 7 (ref 5.0–8.0)
Protein, ur: NEGATIVE mg/dL
Specific Gravity, Urine: 1.025 (ref 1.005–1.030)
UROBILINOGEN UA: 0.2 mg/dL (ref 0.0–1.0)

## 2015-03-05 NOTE — Progress Notes (Signed)
Pain/pressure- lower abd Pt requests having BTL  Reschedule for 28wk lab work pt can not stay today

## 2015-03-05 NOTE — Progress Notes (Signed)
Subjective:  Angela DoffingBarbara Puett is a 30 y.o. [phone] at 4475w1d being seen today for ongoing prenatal care.  She is currently monitored for the following issues for this low-risk pregnancy and has Uncertain dates, antepartum on her problem list.  Patient reports backache, occasional contractions and pain in right hip/leg especially when changing positions in bed at night.  Contractions: Not present. Vag. Bleeding: None.  Movement: Present. Denies leaking of fluid.   The following portions of the patient's history were reviewed and updated as appropriate: allergies, current medications, past family history, past medical history, past social history, past surgical history and problem list. Problem list updated.  Objective:   Filed Vitals:   03/05/15 1110  BP: 110/63  Pulse: 108  Temp: 98.4 F (36.9 C)  Weight: 194 lb 4.8 oz (88.134 kg)    Fetal Status: Fetal Heart Rate (bpm): 144   Movement: Present     General:  Alert, oriented and cooperative. Patient is in no acute distress.  Skin: Skin is warm and dry. No rash noted.   Cardiovascular: Normal heart rate noted  Respiratory: Normal respiratory effort, no problems with respiration noted  Abdomen: Soft, gravid, appropriate for gestational age. Pain/Pressure: Present     Pelvic: Vag. Bleeding: None Vag D/C Character: White   Cervical exam deferred        Extremities: Normal range of motion.  Edema: None  Mental Status: Normal mood and affect. Normal behavior. Normal judgment and thought content.   Urinalysis:      Assessment and Plan:  Pregnancy: [id number] at 3875w1d  1. History of gestational diabetes in prior pregnancy, currently pregnant, third trimester --Pt failed 1 hour but never completed 3 hour in previous pregnancy.  Unable to stay today for 1 hour, will reschedule for this Friday.  2. Encounter for supervision of other normal pregnancy in third trimester  3. Round ligament pain --Rest/ice/heat/warm bath/Tylenol  4. Pelvic pain  affecting pregnancy in third trimester, antepartum --See above, add good ergonomic positions for sleep, getting up out of chair/bed  5. Uncertain dates, antepartum, second trimester --Evaluated EDD again.  Pt had uncertain dates, with EDD established in [facility] without sure LMP from pt.  US on 10/31 indicated 2467w4d, which was 14 days different from previously established EDD.  Most recent US dates more consistent with this.  EDD adjusted to 05/16/15 based on 20 week US.   Preterm labor symptoms and general obstetric precautions including but not limited to vaginal bleeding, contractions, leaking of fluid and fetal movement were reviewed in detail with the patient. Please refer to After Visit Summary for other counseling recommendations.  Return in about 2 weeks (around 03/19/2015).   Hurshel PartyLisa A Leftwich-Kirby, CNM

## 2015-03-08 ENCOUNTER — Other Ambulatory Visit: Payer: Medicaid Other

## 2015-03-19 ENCOUNTER — Telehealth: Payer: Self-pay | Admitting: Family Medicine

## 2015-03-19 ENCOUNTER — Encounter: Payer: Medicaid Other | Admitting: Student

## 2015-03-19 NOTE — Telephone Encounter (Signed)
Called patient about missed appointment. Left message for her to call us back to reschedule.

## 2015-03-23 ENCOUNTER — Inpatient Hospital Stay (HOSPITAL_COMMUNITY)
Admission: AD | Admit: 2015-03-23 | Discharge: 2015-03-23 | Disposition: A | Discharge status: Home / Self Care | Payer: Medicaid Other | Source: Ambulatory Visit | Attending: Obstetrics & Gynecology | Admitting: Obstetrics & Gynecology

## 2015-03-23 ENCOUNTER — Encounter (HOSPITAL_COMMUNITY): Payer: Self-pay

## 2015-03-23 DIAGNOSIS — Z3A32 32 weeks gestation of pregnancy: Secondary | ICD-10-CM | POA: Diagnosis not present

## 2015-03-23 DIAGNOSIS — A499 Bacterial infection, unspecified: Secondary | ICD-10-CM

## 2015-03-23 DIAGNOSIS — O26853 Spotting complicating pregnancy, third trimester: Secondary | ICD-10-CM | POA: Diagnosis not present

## 2015-03-23 DIAGNOSIS — B9689 Other specified bacterial agents as the cause of diseases classified elsewhere: Secondary | ICD-10-CM

## 2015-03-23 DIAGNOSIS — O98813 Other maternal infectious and parasitic diseases complicating pregnancy, third trimester: Secondary | ICD-10-CM | POA: Insufficient documentation

## 2015-03-23 DIAGNOSIS — B373 Candidiasis of vulva and vagina: Secondary | ICD-10-CM | POA: Insufficient documentation

## 2015-03-23 DIAGNOSIS — B3731 Acute candidiasis of vulva and vagina: Secondary | ICD-10-CM

## 2015-03-23 DIAGNOSIS — O23593 Infection of other part of genital tract in pregnancy, third trimester: Secondary | ICD-10-CM | POA: Diagnosis not present

## 2015-03-23 DIAGNOSIS — N760 Acute vaginitis: Secondary | ICD-10-CM

## 2015-03-23 LAB — URINE MICROSCOPIC-ADD ON

## 2015-03-23 LAB — WET PREP, GENITAL
Sperm: NONE SEEN
Trich, Wet Prep: NONE SEEN

## 2015-03-23 LAB — URINALYSIS, ROUTINE W REFLEX MICROSCOPIC
BILIRUBIN URINE: NEGATIVE
GLUCOSE, UA: NEGATIVE mg/dL
KETONES UR: NEGATIVE mg/dL
NITRITE: NEGATIVE
PH: 6.5 (ref 5.0–8.0)
PROTEIN: NEGATIVE mg/dL
Specific Gravity, Urine: 1.015 (ref 1.005–1.030)

## 2015-03-23 MED ORDER — METRONIDAZOLE 500 MG PO TABS: 500.0000 mg | ORAL_TABLET | Freq: Two times a day (BID) | ORAL | Status: DC

## 2015-03-23 NOTE — MAU Note (Signed)
Notified provider that patient presents with c/o light vaginal bleeding that started this morning. No other complaints. Provider said she would be done to see the patient.

## 2015-03-23 NOTE — MAU Note (Signed)
Patient presents with c/o light bleeding that started this morning she only see it when she wipes. Patient denies any LOF fetus active

## 2015-03-23 NOTE — MAU Provider Note (Signed)
History  Chief Complaint:  Vaginal Bleeding  Angela Maldonado is a 30 y.o. [phone] female at [redacted]w[redacted]d presenting w/ report of light pink spotting prior to work this am, then when she got home she wiped again and was brighter red spotting. Last sexual intercourse about 2wks ago.    Reports active fetal movement, contractions: none, vaginal bleeding: spotting, membranes: intact. Denies uti s/s, abnormal/malodorous vag d/c or vulvovaginal itching/irritation.   Prenatal care at [facility].  Next visit none scheduled- missed last visit. Pregnancy complicated by nothing  Obstetrical History: OB History    Gravida Para Term Preterm AB TAB SAB Ectopic Multiple Living   0 2 2   0 2      Obstetric Comments   Induced, gest diab- diet controlled      Past Medical History: Past Medical History  Diagnosis Date  . Gestational diabetes   . Vaginal Pap smear, abnormal     colpo, ok since    Past Surgical History: Past Surgical History  Procedure Laterality Date  . Induced abortion      x2  . Colposcopy      Social History: Social History   Social History  . Marital Status: Single    Spouse Name: N/A  . Number of Children: N/A  . Years of Education: N/A   Social History Main Topics  . Smoking status: Current Every Day Smoker -- 0.25 packs/day for 5 years    Types: Cigarettes  . Smokeless tobacco: Never Used  . Alcohol Use: No  . Drug Use: No  . Sexual Activity: Yes    Birth Control/ Protection: None     Comment: Had been off the pill briefly   Other Topics Concern  . None   Social History Narrative    Allergies: No Known Allergies  No prescriptions prior to admission    Review of Systems  Pertinent pos/neg as indicated in HPI  Physical Exam  Blood pressure 123/73, pulse 107, temperature 98 F (36.7 C), temperature source Oral, resp. rate 16. General appearance: alert, cooperative and no distress Lungs: clear to auscultation bilaterally, normal effort Heart: regular  rate and rhythm Abdomen: gravid, soft, non-tender  Spec exam: cx visually closed, large amt thick white clumpy slightly malodorous d/c c/w yeast, no blood noted Cultures/Specimens: wet prep, gc/ct SVE: LTC    Fetal monitoring: FHR: 140 bpm, variability: moderate,  Accelerations: Present,  decelerations:  Absent Uterine activity: none  MAU Course  UA NST Spec exam w/ wet prep, gc/ct SVE  Labs:  Results for orders placed or performed during the hospital encounter of 03/23/15 (from the past 24 hour(s))  Wet prep, genital     Status: Abnormal   Collection Time: 03/23/15  8:53 PM  Result Value Ref Range   Yeast Wet Prep HPF POC PRESENT (A) NONE SEEN   Trich, Wet Prep NONE SEEN NONE SEEN   Clue Cells Wet Prep HPF POC PRESENT (A) NONE SEEN   WBC, Wet Prep HPF POC MODERATE (A) NONE SEEN   Sperm NONE SEEN   Urinalysis, Routine w reflex microscopic (not at [facility])     Status: Abnormal   Collection Time: 03/23/15  8:56 PM  Result Value Ref Range   Color, Urine YELLOW YELLOW   APPearance CLEAR CLEAR   Specific Gravity, Urine 1.015 1.005 - 1.030   pH 6.5 5.0 - 8.0   Glucose, UA NEGATIVE NEGATIVE mg/dL   Hgb urine dipstick MODERATE (A) NEGATIVE   Bilirubin Urine NEGATIVE  NEGATIVE   Ketones, ur NEGATIVE NEGATIVE mg/dL   Protein, ur NEGATIVE NEGATIVE mg/dL   Nitrite NEGATIVE NEGATIVE   Leukocytes, UA MODERATE (A) NEGATIVE  Urine microscopic-add on     Status: Abnormal   Collection Time: 03/23/15  8:56 PM  Result Value Ref Range   Squamous Epithelial / LPF 0-5 (A) NONE SEEN   WBC, UA 6-30 0 - 5 WBC/hpf   RBC / HPF 0-5 0 - 5 RBC/hpf   Bacteria, UA FEW (A) NONE SEEN   Urine-Other MUCOUS PRESENT     Imaging:  n/a  Assessment and Plan  A:  [redacted]w[redacted]d SIUP  Z6X0960  Spotting likely r/t heavy yeast infection and BV  Vulvovaginal candida  BV  Cat 1 FHR P:  D/C home  OTC Monistat 7 for yeast  Rx metronidazole  BID x 7d for BV  No sex until finished w/ both meds  Reviewed ptl  s/s, fkc  Call clinic on Monday to schedule f/u appt- as she missed last appt    Marge Duncans CNM,WHNP-BC 1/22/20171:47 AM

## 2015-03-23 NOTE — Discharge Instructions (Signed)
Use over the counter Monistat 7 (7 night treatment) for your yeast infection I have sent a prescription to your pharmacy for the bacterial vaginosis (BV)- do not have sex while taking medicine  Call the clinic [phone]) or go to [facility] if:  You begin to have strong, frequent contractions  Your water breaks.  Sometimes it is a big gush of fluid, sometimes it is just a trickle that keeps getting your panties wet or running down your legs  You have vaginal bleeding.  It is normal to have a small amount of spotting if your cervix was checked.   You don't feel your baby moving like normal.  If you don't, get you something to eat and drink and lay down and focus on feeling your baby move.  You should feel at least 10 movements in 2 hours.  If you don't, you should call the office or go to [facility].   Monilial Vaginitis Vaginitis in a soreness, swelling and redness (inflammation) of the vagina and vulva. Monilial vaginitis is not a sexually transmitted infection. CAUSES  Yeast vaginitis is caused by yeast (candida) that is normally found in your vagina. With a yeast infection, the candida has overgrown in number to a point that upsets the chemical balance. SYMPTOMS   White, thick vaginal discharge.  Swelling, itching, redness and irritation of the vagina and possibly the lips of the vagina (vulva).  Burning or painful urination.  Painful intercourse. DIAGNOSIS  Things that may contribute to monilial vaginitis are:  Postmenopausal and virginal states.  Pregnancy.  Infections.  Being tired, sick or stressed, especially if you had monilial vaginitis in the past.  Diabetes. Good control will help lower the chance.  Birth control pills.  Tight fitting garments.  Using bubble bath, feminine sprays, douches or deodorant tampons.  Taking certain medications that kill germs (antibiotics).  Sporadic recurrence can occur if you become ill. TREATMENT  Your caregiver  will give you medication.  There are several kinds of anti monilial vaginal creams and suppositories specific for monilial vaginitis. For recurrent yeast infections, use a suppository or cream in the vagina 2 times a week, or as directed.  Anti-monilial or steroid cream for the itching or irritation of the vulva may also be used. Get your caregiver's permission.  Painting the vagina with methylene blue solution may help if the monilial cream does not work.  Eating yogurt may help prevent monilial vaginitis. HOME CARE INSTRUCTIONS   Finish all medication as prescribed.  Do not have sex until treatment is completed or after your caregiver tells you it is okay.  Take warm sitz baths.  Do not douche.  Do not use tampons, especially scented ones.  Wear cotton underwear.  Avoid tight pants and panty hose.  Tell your sexual partner that you have a yeast infection. They should go to their caregiver if they have symptoms such as mild rash or itching.  Your sexual partner should be treated as well if your infection is difficult to eliminate.  Practice safer sex. Use condoms.  Some vaginal medications cause latex condoms to fail. Vaginal medications that harm condoms are:  Cleocin cream.  Butoconazole (Femstat).  Terconazole (Terazol) vaginal suppository.  Miconazole (Monistat) (may be purchased over the counter). SEEK MEDICAL CARE IF:   You have a temperature by mouth above 102 F (38.9 C).  The infection is getting worse after 2 days of treatment.  The infection is not getting better after 3 days of treatment.  You  develop blisters in or around your vagina.  You develop vaginal bleeding, and it is not your menstrual period.  You have pain when you urinate.  You develop intestinal problems.  You have pain with sexual intercourse.   This information is not intended to replace advice given to you by your health care provider. Make sure you discuss any questions you  have with your health care provider.   Document Released: 11/26/2004 Document Revised: 05/11/2011 Document Reviewed: 08/20/2014 Elsevier Interactive Patient Education 2016 Elsevier Inc.  Bacterial Vaginosis Bacterial vaginosis is a vaginal infection that occurs when the normal balance of bacteria in the vagina is disrupted. It results from an overgrowth of certain bacteria. This is the most common vaginal infection in women of childbearing age. Treatment is important to prevent complications, especially in pregnant women, as it can cause a premature delivery. CAUSES  Bacterial vaginosis is caused by an increase in harmful bacteria that are normally present in smaller amounts in the vagina. Several different kinds of bacteria can cause bacterial vaginosis. However, the reason that the condition develops is not fully understood. RISK FACTORS Certain activities or behaviors can put you at an increased risk of developing bacterial vaginosis, including:  Having a new sex partner or multiple sex partners.  Douching.  Using an intrauterine device (IUD) for contraception. Women do not get bacterial vaginosis from toilet seats, bedding, swimming pools, or contact with objects around them. SIGNS AND SYMPTOMS  Some women with bacterial vaginosis have no signs or symptoms. Common symptoms include:  Grey vaginal discharge.  A fishlike odor with discharge, especially after sexual intercourse.  Itching or burning of the vagina and vulva.  Burning or pain with urination. DIAGNOSIS  Your health care provider will take a medical history and examine the vagina for signs of bacterial vaginosis. A sample of vaginal fluid may be taken. Your health care provider will look at this sample under a microscope to check for bacteria and abnormal cells. A vaginal pH test may also be done.  TREATMENT  Bacterial vaginosis may be treated with antibiotic medicines. These may be given in the form of a pill or a vaginal  cream. A second round of antibiotics may be prescribed if the condition comes back after treatment. Because bacterial vaginosis increases your risk for sexually transmitted diseases, getting treated can help reduce your risk for chlamydia, gonorrhea, HIV, and herpes. HOME CARE INSTRUCTIONS   Only take over-the-counter or prescription medicines as directed by your health care provider.  If antibiotic medicine was prescribed, take it as directed. Make sure you finish it even if you start to feel better.  Tell all sexual partners that you have a vaginal infection. They should see their health care provider and be treated if they have problems, such as a mild rash or itching.  During treatment, it is important that you follow these instructions:  Avoid sexual activity or use condoms correctly.  Do not douche.  Avoid alcohol as directed by your health care provider.  Avoid breastfeeding as directed by your health care provider. SEEK MEDICAL CARE IF:   Your symptoms are not improving after 3 days of treatment.  You have increased discharge or pain.  You have a fever. MAKE SURE YOU:   Understand these instructions.  Will watch your condition.  Will get help right away if you are not doing well or get worse. FOR MORE INFORMATION  Centers for Disease Control and Prevention, Division of STD Prevention: SolutionApps.co.za American Sexual Health Association (  ASHA): www.ashastd.org    This information is not intended to replace advice given to you by your health care provider. Make sure you discuss any questions you have with your health care provider.   Document Released: 02/16/2005 Document Revised: 03/09/2014 Document Reviewed: 09/28/2012 Elsevier Interactive Patient Education [employer]

## 2015-03-25 LAB — GC/CHLAMYDIA PROBE AMP (~~LOC~~) NOT AT ARMC
CHLAMYDIA, DNA PROBE: NEGATIVE
Neisseria Gonorrhea: NEGATIVE

## 2015-04-09 ENCOUNTER — Ambulatory Visit (INDEPENDENT_AMBULATORY_CARE_PROVIDER_SITE_OTHER): Payer: Medicaid Other | Admitting: Family

## 2015-04-09 VITALS — BP 115/75 | HR 112 | Temp 98.3°F | Wt 204.9 lb

## 2015-04-09 DIAGNOSIS — Z3483 Encounter for supervision of other normal pregnancy, third trimester: Secondary | ICD-10-CM | POA: Diagnosis present

## 2015-04-09 DIAGNOSIS — R8271 Bacteriuria: Secondary | ICD-10-CM | POA: Diagnosis not present

## 2015-04-09 LAB — CBC
HCT: 30.8 % — ABNORMAL LOW (ref 36.0–46.0)
Hemoglobin: 10.2 g/dL — ABNORMAL LOW (ref 12.0–15.0)
MCH: 28.8 pg (ref 26.0–34.0)
MCHC: 33.1 g/dL (ref 30.0–36.0)
MCV: 87 fL (ref 78.0–100.0)
MPV: 9.6 fL (ref 8.6–12.4)
Platelets: 269 10*3/uL (ref 150–400)
RBC: 3.54 MIL/uL — ABNORMAL LOW (ref 3.87–5.11)
RDW: 13.1 % (ref 11.5–15.5)
WBC: 12 10*3/uL — AB (ref 4.0–10.5)

## 2015-04-09 LAB — POCT URINALYSIS DIP (DEVICE)
Bilirubin Urine: NEGATIVE
GLUCOSE, UA: NEGATIVE mg/dL
Ketones, ur: NEGATIVE mg/dL
Nitrite: NEGATIVE
PROTEIN: NEGATIVE mg/dL
Specific Gravity, Urine: 1.015 (ref 1.005–1.030)
UROBILINOGEN UA: 0.2 mg/dL (ref 0.0–1.0)
pH: 6.5 (ref 5.0–8.0)

## 2015-04-09 NOTE — Progress Notes (Signed)
Subjective:  Angela Maldonado is a 30 y.o. Z6X0960 at [redacted]w[redacted]d being seen today for ongoing prenatal care.  She is currently monitored for the following issues for this low-risk pregnancy and has supervision of normal subsequent pregnancy on her problem list.  Patient reports no complaints.  Declines doing pap smear today.  Plans to obtain at visit next week with GBS and GC/CT.   Contractions: Not present. Vag. Bleeding: None.  Movement: Present. Denies leaking of fluid.   The following portions of the patient's history were reviewed and updated as appropriate: allergies, current medications, past family history, past medical history, past social history, past surgical history and problem list. Problem list updated.  Objective:   Filed Vitals:   04/09/15 0955  BP: 115/75  Pulse: 112  Temp: 98.3 F (36.8 C)  Weight: 204 lb 14.4 oz (92.942 kg)    Fetal Status: Fetal Heart Rate (bpm): 160 Fundal Height: 35 cm Movement: Present     General:  Alert, oriented and cooperative. Patient is in no acute distress.  Skin: Skin is warm and dry. No rash noted.   Cardiovascular: Normal heart rate noted  Respiratory: Normal respiratory effort, no problems with respiration noted  Abdomen: Soft, gravid, appropriate for gestational age. Pain/Pressure: Present     Pelvic: Vag. Bleeding: None     Cervical exam deferred        Extremities: Normal range of motion.  Edema: None  Mental Status: Normal mood and affect. Normal behavior. Normal judgment and thought content.   Urinalysis: Urine Protein: Negative Urine Glucose: Negative  Assessment and Plan:  Pregnancy: [id number] at [redacted]w[redacted]d  1. Encounter for supervision of other normal pregnancy in third trimester - Glucose Tolerance, 1 HR (50g) w/o Fasting - CBC - RPR - HIV antibody (with reflex) - Patient declined pap today > plan to obtain next week with GBS and GC/CT - BTL consent signed today  2. Bacteria in urine - Culture, OB Urine  Preterm labor  symptoms and general obstetric precautions including but not limited to vaginal bleeding, contractions, leaking of fluid and fetal movement were reviewed in detail with the patient. Please refer to After Visit Summary for other counseling recommendations.  Return in about 1 week (around 04/16/2015).   Eino Farber Kennith Gain, CNM

## 2015-04-09 NOTE — Progress Notes (Signed)
Large leukocytes noted on urinalysis. BTL papers signed.

## 2015-04-09 NOTE — Patient Instructions (Signed)
AREA PEDIATRIC/FAMILY PRACTICE PHYSICIANS  [facility] [address] [place], [place] [address] Phone - [phone]   Fax - [phone]  [facility] [address] [place], [place]  [address] Phone - [phone]   Fax - [phone]  [facility] [address] [place], [place]  [address] Phone - [phone]   Fax - [phone]  [facility] [address] [place], [place]  [address] Phone - [phone]   Fax - [phone]  [facility] [address] [place], [place]  [address] Phone - [phone]   Fax - [phone]  [facility] [address] [place], [place]  [address] Phone - [phone]   Fax - [phone]  [facility] [address] [place], [place]  [address] Phone - [phone]   Fax - [phone]  [facility] [address] [place], [place]  [address] Phone - [phone]   Fax - [phone]  [facility] [address] [place], [place]  [address] Phone - [phone]   Fax - [phone] [facility] [address] [place], [place]  [address] Phone - [phone]   Fax - [phone]  [facility] [address] [place], [place]  [address] Phone - [phone]   Fax - [phone]  [facility] [address] [place], [place]  [address] Phone - [phone]   Fax - [phone]  [facility] [address] [place], [place]  [address] Phone - [phone]   Fax - [phone]  [facility] [address] [place], [place]  [address] Phone - [phone]  [facility] [address] [place], [place]  [address] Phone - [phone]   Fax - [phone]  [facility] [address] [place], [place]  [address] Phone - [phone]   Fax - [phone]  [facility] [address] [place], [place]  [address] Phone - [phone]   Fax - [phone]   [facility] [address] [place], [place]  [address] Phone - [phone]   Fax - [phone]   [facility] [address] [place], [place]  [address] Phone - [phone]   Fax - [phone]  [facility] [address] [place], [place]  [address] Phone - [phone]   Fax - [phone]  [facility] [address] [place], [place]  [address] Phone - [phone]   Fax - [phone]  [facility] [address] [place], [place]  [address] Phone - [phone]   Fax - [phone]  [facility] [address] , [place]  [address] Phone - [phone]   Fax - [phone] [facility] [address] [place], [place]  [address] Phone - [phone]   Fax - [phone]  [facility] [address] [place], [place]  [address] Phone - [phone]   Fax - [phone]  [facility] [address] [place],   [address] Phone - [phone]   Fax - [phone]   

## 2015-04-09 NOTE — Progress Notes (Signed)
Denies UTI symptoms at this visit.

## 2015-04-10 ENCOUNTER — Telehealth: Payer: Self-pay | Admitting: Family

## 2015-04-10 LAB — HIV ANTIBODY (ROUTINE TESTING W REFLEX): HIV: NONREACTIVE

## 2015-04-10 LAB — CULTURE, OB URINE: Colony Count: 50000

## 2015-04-10 LAB — RPR

## 2015-04-10 LAB — GLUCOSE TOLERANCE, 1 HOUR (50G) W/O FASTING: GLUCOSE 1 HOUR GTT: 183 mg/dL — AB (ref 70–140)

## 2015-04-10 NOTE — Telephone Encounter (Signed)
Notified patient regarding 1 hr of 183 and need for 3 hr; pt will come at 0800 for on 04/17/15 prior to her appt.

## 2015-04-17 ENCOUNTER — Encounter: Payer: Self-pay | Admitting: Family

## 2015-04-17 ENCOUNTER — Telehealth: Payer: Self-pay

## 2015-04-17 ENCOUNTER — Ambulatory Visit (INDEPENDENT_AMBULATORY_CARE_PROVIDER_SITE_OTHER): Payer: Medicaid Other | Admitting: Family

## 2015-04-17 ENCOUNTER — Other Ambulatory Visit (HOSPITAL_COMMUNITY)
Admission: RE | Admit: 2015-04-17 | Discharge: 2015-04-17 | Disposition: A | Discharge status: Home / Self Care | Payer: Medicaid Other | Source: Ambulatory Visit | Attending: Family | Admitting: Family

## 2015-04-17 VITALS — BP 123/74 | HR 116 | Temp 98.0°F

## 2015-04-17 DIAGNOSIS — Z1151 Encounter for screening for human papillomavirus (HPV): Secondary | ICD-10-CM | POA: Insufficient documentation

## 2015-04-17 DIAGNOSIS — Z113 Encounter for screening for infections with a predominantly sexual mode of transmission: Secondary | ICD-10-CM

## 2015-04-17 DIAGNOSIS — Z3483 Encounter for supervision of other normal pregnancy, third trimester: Secondary | ICD-10-CM | POA: Diagnosis present

## 2015-04-17 DIAGNOSIS — Z01419 Encounter for gynecological examination (general) (routine) without abnormal findings: Secondary | ICD-10-CM | POA: Insufficient documentation

## 2015-04-17 DIAGNOSIS — Z124 Encounter for screening for malignant neoplasm of cervix: Secondary | ICD-10-CM | POA: Diagnosis not present

## 2015-04-17 DIAGNOSIS — O99810 Abnormal glucose complicating pregnancy: Secondary | ICD-10-CM | POA: Diagnosis not present

## 2015-04-17 DIAGNOSIS — Z3493 Encounter for supervision of normal pregnancy, unspecified, third trimester: Secondary | ICD-10-CM

## 2015-04-17 LAB — POCT URINALYSIS DIP (DEVICE)
Bilirubin Urine: NEGATIVE
Glucose, UA: NEGATIVE mg/dL
Ketones, ur: NEGATIVE mg/dL
Nitrite: NEGATIVE
Protein, ur: NEGATIVE mg/dL
Specific Gravity, Urine: 1.02 (ref 1.005–1.030)
Urobilinogen, UA: 0.2 mg/dL (ref 0.0–1.0)
pH: 7 (ref 5.0–8.0)

## 2015-04-17 LAB — OB RESULTS CONSOLE GBS: GBS: POSITIVE

## 2015-04-17 MED ORDER — FLUCONAZOLE 150 MG PO TABS: 150.0000 mg | ORAL_TABLET | Freq: Once | ORAL | Status: DC

## 2015-04-17 MED ORDER — TERCONAZOLE 0.4 % VA CREA: 1.0000 | TOPICAL_CREAM | Freq: Every day | VAGINAL | Status: DC

## 2015-04-17 NOTE — Progress Notes (Signed)
Subjective:  Angela Maldonado is a 30 y.o. [id number] at [redacted]w[redacted]d being seen today for ongoing prenatal care.  She is currently monitored for the following issues for this low-risk pregnancy and has supervision of normal subsequent pregnancy on her problem list.  Patient reports increased vaginal discharge and itching.  Contractions: Not present. Vag. Bleeding: None.  Movement: Present. Denies leaking of fluid.   The following portions of the patient's history were reviewed and updated as appropriate: allergies, current medications, past family history, past medical history, past social history, past surgical history and problem list. Problem list updated.  Objective:   Filed Vitals:   04/17/15 0836  BP: 123/74  Pulse: 116  Temp: 98 F (36.7 C)    Fetal Status: Fetal Heart Rate (bpm): 145 Fundal Height: 36 cm Movement: Present  Presentation: Vertex  General:  Alert, oriented and cooperative. Patient is in no acute distress.  Skin: Skin is warm and dry. No rash noted.   Cardiovascular: Normal heart rate noted  Respiratory: Normal respiratory effort, no problems with respiration noted  Abdomen: Soft, gravid, appropriate for gestational age. Pain/Pressure: Present     Pelvic: Vag. Bleeding: None     Cervical exam performed Dilation: Closed Effacement (%): Thick  Thick white curd-like; erythematous  Extremities: Normal range of motion.     Mental Status: Normal mood and affect. Normal behavior. Normal judgment and thought content.   Urinalysis: Urine Protein: Negative Urine Glucose: Negative  Assessment and Plan:  Pregnancy: [id number] at [redacted]w[redacted]d  1. Abnormal glucose tolerance test during pregnancy, antepartum - Glucose tolerance, 3 hours  2. Supervision of Normal Pregnacy - Cytology - PAP - GC/Chlamydia probe amp ([place])not at [facility] - Culture, beta strep (group b only)  3.  Vaginitis - RX Terazol and Diflucan   Preterm labor symptoms and general obstetric precautions including but  not limited to vaginal bleeding, contractions, leaking of fluid and fetal movement were reviewed in detail with the patient. Please refer to After Visit Summary for other counseling recommendations.  Return in about 1 week (around 04/24/2015).   Eino Farber Kennith Gain, CNM

## 2015-04-17 NOTE — Telephone Encounter (Signed)
Error

## 2015-04-18 LAB — GLUCOSE TOLERANCE, 3 HOURS
GLUCOSE 3 HOUR GTT: 116 mg/dL (ref 70–144)
GLUCOSE, 1 HOUR-GESTATIONAL: 181 mg/dL (ref 70–189)
GLUCOSE, FASTING-GESTATIONAL: 73 mg/dL (ref 65–99)
Glucose Tolerance, 2 hour: 125 mg/dL (ref 70–164)

## 2015-04-18 LAB — GC/CHLAMYDIA PROBE AMP (~~LOC~~) NOT AT ARMC
CHLAMYDIA, DNA PROBE: NEGATIVE
NEISSERIA GONORRHEA: NEGATIVE

## 2015-04-18 LAB — CYTOLOGY - PAP

## 2015-04-20 LAB — CULTURE, BETA STREP (GROUP B ONLY)

## 2015-04-22 ENCOUNTER — Telehealth: Payer: Self-pay

## 2015-04-22 NOTE — Telephone Encounter (Signed)
Per Dorathy Kinsman, CNM, please call pt with normal 3 hr gtt results.  Notified pt of normal results.  Pt stated "thank you" with no further questions.

## 2015-04-23 ENCOUNTER — Encounter: Payer: Self-pay | Admitting: Family

## 2015-04-23 ENCOUNTER — Ambulatory Visit (INDEPENDENT_AMBULATORY_CARE_PROVIDER_SITE_OTHER): Payer: Medicaid Other | Admitting: Family

## 2015-04-23 VITALS — BP 117/70 | HR 105 | Temp 98.3°F | Wt 207.6 lb

## 2015-04-23 DIAGNOSIS — O99810 Abnormal glucose complicating pregnancy: Secondary | ICD-10-CM | POA: Diagnosis not present

## 2015-04-23 DIAGNOSIS — Z3483 Encounter for supervision of other normal pregnancy, third trimester: Secondary | ICD-10-CM | POA: Diagnosis not present

## 2015-04-23 LAB — POCT URINALYSIS DIP (DEVICE)
Bilirubin Urine: NEGATIVE
GLUCOSE, UA: NEGATIVE mg/dL
Ketones, ur: NEGATIVE mg/dL
NITRITE: NEGATIVE
PROTEIN: NEGATIVE mg/dL
SPECIFIC GRAVITY, URINE: 1.02 (ref 1.005–1.030)
UROBILINOGEN UA: 0.2 mg/dL (ref 0.0–1.0)
pH: 7 (ref 5.0–8.0)

## 2015-04-23 NOTE — Progress Notes (Signed)
Breastfeeding tip of the week reviewed. 

## 2015-04-23 NOTE — Progress Notes (Signed)
Subjective:  Angela Maldonado is a 30 y.o. [phone] at [redacted]w[redacted]d being seen today for ongoing prenatal care.  She is currently monitored for the following issues for this low-risk pregnancy and has Supervision of normal subsequent pregnancy and Abnormal glucose tolerance affecting pregnancy, antepartum on her problem list.  Patient reports lower pelvis with walking.  Recent relationship changes.  Contractions: Irregular. Vag. Bleeding: None.  Movement: Present. Denies leaking of fluid.   The following portions of the patient's history were reviewed and updated as appropriate: allergies, current medications, past family history, past medical history, past social history, past surgical history and problem list. Problem list updated.  Objective:   Filed Vitals:   04/23/15 0931  BP: 117/70  Pulse: 105  Temp: 98.3 F (36.8 C)  Weight: 207 lb 9.6 oz (94.167 kg)    Fetal Status: Fetal Heart Rate (bpm): 142 Fundal Height: 37 cm Movement: Present  Presentation: Vertex  General:  Alert, oriented and cooperative. Patient is in no acute distress.  Skin: Skin is warm and dry. No rash noted.   Cardiovascular: Normal heart rate noted  Respiratory: Normal respiratory effort, no problems with respiration noted  Abdomen: Soft, gravid, appropriate for gestational age. Pain/Pressure: Present     Pelvic: Vag. Bleeding: None     Cervical exam deferred        Extremities: Normal range of motion.  Edema: Trace  Mental Status: Normal mood and affect. Normal behavior. Normal judgment and thought content.   Urinalysis: Urine Protein: Negative Urine Glucose: Negative  Assessment and Plan:  Pregnancy: [id number] at [redacted]w[redacted]d  1. Encounter for supervision of other normal pregnancy in third trimester - Reviewed 3 hr results (nml) and GBS results  Term labor symptoms and general obstetric precautions including but not limited to vaginal bleeding, contractions, leaking of fluid and fetal movement were reviewed in detail with  the patient. Please refer to After Visit Summary for other counseling recommendations.  Return in about 1 week (around 04/30/2015) for schedule weekly with [id number].   Eino Farber Kennith Gain, CNM

## 2015-04-30 ENCOUNTER — Ambulatory Visit (INDEPENDENT_AMBULATORY_CARE_PROVIDER_SITE_OTHER): Payer: Medicaid Other | Admitting: Family

## 2015-04-30 VITALS — BP 107/67 | HR 103 | Temp 98.4°F | Wt 209.4 lb

## 2015-04-30 DIAGNOSIS — Z3493 Encounter for supervision of normal pregnancy, unspecified, third trimester: Secondary | ICD-10-CM

## 2015-04-30 LAB — POCT URINALYSIS DIP (DEVICE)
BILIRUBIN URINE: NEGATIVE
GLUCOSE, UA: NEGATIVE mg/dL
Ketones, ur: NEGATIVE mg/dL
NITRITE: NEGATIVE
Protein, ur: NEGATIVE mg/dL
Specific Gravity, Urine: 1.02 (ref 1.005–1.030)
UROBILINOGEN UA: 0.2 mg/dL (ref 0.0–1.0)
pH: 7 (ref 5.0–8.0)

## 2015-04-30 MED ORDER — CYCLOBENZAPRINE HCL 10 MG PO TABS: 10.0000 mg | ORAL_TABLET | Freq: Every day | ORAL | Status: DC

## 2015-04-30 NOTE — Progress Notes (Signed)
.   Subjective:  Angela Maldonado is a 30 y.o. [phone] at [redacted]w[redacted]d being seen today for ongoing prenatal care.  She is currently monitored for the following issues for this low-risk pregnancy and has Supervision of normal subsequent pregnancy and Abnormal glucose tolerance affecting pregnancy, antepartum on her problem list.  Patient reports lower pelvic pressure.  Contractions: Irregular. Vag. Bleeding: None.  Movement: Present. Denies leaking of fluid.   The following portions of the patient's history were reviewed and updated as appropriate: allergies, current medications, past family history, past medical history, past social history, past surgical history and problem list. Problem list updated.  Objective:   Filed Vitals:   04/30/15 0935  BP: 107/67  Pulse: 103  Temp: 98.4 F (36.9 C)  Weight: 209 lb 6.4 oz (94.983 kg)    Fetal Status: Fetal Heart Rate (bpm): 152   Movement: Present     General:  Alert, oriented and cooperative. Patient is in no acute distress.  Skin: Skin is warm and dry. No rash noted.   Cardiovascular: Normal heart rate noted  Respiratory: Normal respiratory effort, no problems with respiration noted  Abdomen: Soft, gravid, appropriate for gestational age. Pain/Pressure: Present     Pelvic: Vag. Bleeding: None     Cervical exam deferred        Extremities: Normal range of motion.  Edema: Trace  Mental Status: Normal mood and affect. Normal behavior. Normal judgment and thought content.   Urinalysis: Urine Protein: Negative Urine Glucose: Negative  Assessment and Plan:  Pregnancy: A6T0160 at [redacted]w[redacted]d  There are no diagnoses linked to this encounter. Term labor symptoms and general obstetric precautions including but not limited to vaginal bleeding, contractions, leaking of fluid and fetal movement were reviewed in detail with the patient. Please refer to After Visit Summary for other counseling recommendations.  Return in about 1 week (around  05/07/2015).   Eino Farber Kennith Gain, CNM

## 2015-04-30 NOTE — Progress Notes (Signed)
Breastfeeding tip of the week reviewed. 

## 2015-05-07 ENCOUNTER — Ambulatory Visit (INDEPENDENT_AMBULATORY_CARE_PROVIDER_SITE_OTHER): Payer: Medicaid Other | Admitting: Family

## 2015-05-07 VITALS — BP 129/73 | HR 112 | Temp 98.0°F | Wt 212.0 lb

## 2015-05-07 DIAGNOSIS — Z3483 Encounter for supervision of other normal pregnancy, third trimester: Secondary | ICD-10-CM

## 2015-05-07 LAB — POCT URINALYSIS DIP (DEVICE)
Bilirubin Urine: NEGATIVE
Glucose, UA: NEGATIVE mg/dL
Ketones, ur: NEGATIVE mg/dL
Nitrite: NEGATIVE
PH: 7 (ref 5.0–8.0)
PROTEIN: NEGATIVE mg/dL
Specific Gravity, Urine: 1.02 (ref 1.005–1.030)
UROBILINOGEN UA: 0.2 mg/dL (ref 0.0–1.0)

## 2015-05-07 NOTE — Progress Notes (Signed)
Educated pt on Skin to Skin  

## 2015-05-07 NOTE — Progress Notes (Signed)
Subjective:  Angela DoffingBarbara Maldonado is a 30 y.o. [phone] at 1358w5d being seen today for ongoing prenatal care.  She is currently monitored for the following issues for this low-risk pregnancy and has Supervision of normal subsequent pregnancy and Abnormal glucose tolerance affecting pregnancy, antepartum on her problem list.  Patient reports increased pelvic pressure and back pain.  "Lots of baby movement".  .  Contractions: Not present. Vag. Bleeding: None.  Movement: Present. Denies leaking of fluid.   The following portions of the patient's history were reviewed and updated as appropriate: allergies, current medications, past family history, past medical history, past social history, past surgical history and problem list. Problem list updated.  Objective:   Filed Vitals:   05/07/15 0954  BP: 129/73  Pulse: 112  Temp: 98 F (36.7 C)  Weight: 212 lb (96.163 kg)    Fetal Status: Fetal Heart Rate (bpm): 150 Fundal Height: 37 cm Movement: Present  Presentation: Vertex  General:  Alert, oriented and cooperative. Patient is in no acute distress.  Skin: Skin is warm and dry. No rash noted.   Cardiovascular: Normal heart rate noted  Respiratory: Normal respiratory effort, no problems with respiration noted  Abdomen: Soft, gravid, appropriate for gestational age. Pain/Pressure: Present     Pelvic: Vag. Bleeding: None     Cervical exam performed Dilation: 1 Effacement (%): Thick    Extremities: Normal range of motion.  Edema: Trace  Mental Status: Normal mood and affect. Normal behavior. Normal judgment and thought content.   Urinalysis: Urine Protein: Negative Urine Glucose: Negative  Assessment and Plan:  Pregnancy: [id number] at 4158w5d  1. Encounter for supervision of other normal pregnancy in third trimester - Reviewed signs of labor (prior pregnancies induced) - Discussed process for triage (MAU, observation, admission criteria > cervical change, not dilation alone) - NST next visit - Monitor  fetal movement; report decreased movement (reviewed criteria and given handout)  Term labor symptoms and general obstetric precautions including but not limited to vaginal bleeding, contractions, leaking of fluid and fetal movement were reviewed in detail with the patient. Please refer to After Visit Summary for other counseling recommendations.  Return in about 1 week (around 05/14/2015).   Eino FarberWalidah Kennith GainN Karim, CNM

## 2015-05-10 ENCOUNTER — Encounter (HOSPITAL_COMMUNITY): Payer: Self-pay | Admitting: *Deleted

## 2015-05-10 ENCOUNTER — Inpatient Hospital Stay (HOSPITAL_COMMUNITY)
Admission: AD | Admit: 2015-05-10 | Discharge: 2015-05-10 | Disposition: A | Discharge status: Home / Self Care | Payer: Medicaid Other | Source: Ambulatory Visit | Attending: Family Medicine | Admitting: Family Medicine

## 2015-05-10 DIAGNOSIS — F17210 Nicotine dependence, cigarettes, uncomplicated: Secondary | ICD-10-CM | POA: Diagnosis not present

## 2015-05-10 DIAGNOSIS — Z3483 Encounter for supervision of other normal pregnancy, third trimester: Secondary | ICD-10-CM

## 2015-05-10 DIAGNOSIS — O368130 Decreased fetal movements, third trimester, not applicable or unspecified: Secondary | ICD-10-CM | POA: Insufficient documentation

## 2015-05-10 DIAGNOSIS — O368190 Decreased fetal movements, unspecified trimester, not applicable or unspecified: Secondary | ICD-10-CM

## 2015-05-10 DIAGNOSIS — Z3A39 39 weeks gestation of pregnancy: Secondary | ICD-10-CM | POA: Diagnosis not present

## 2015-05-10 DIAGNOSIS — O99333 Smoking (tobacco) complicating pregnancy, third trimester: Secondary | ICD-10-CM | POA: Insufficient documentation

## 2015-05-10 LAB — URINALYSIS, ROUTINE W REFLEX MICROSCOPIC
Bilirubin Urine: NEGATIVE
GLUCOSE, UA: NEGATIVE mg/dL
Ketones, ur: NEGATIVE mg/dL
Nitrite: NEGATIVE
Protein, ur: NEGATIVE mg/dL
SPECIFIC GRAVITY, URINE: 1.01 (ref 1.005–1.030)
pH: 6.5 (ref 5.0–8.0)

## 2015-05-10 LAB — WET PREP, GENITAL
Clue Cells Wet Prep HPF POC: NONE SEEN
Sperm: NONE SEEN
TRICH WET PREP: NONE SEEN
YEAST WET PREP: NONE SEEN

## 2015-05-10 LAB — URINE MICROSCOPIC-ADD ON: RBC / HPF: NONE SEEN RBC/hpf (ref 0–5)

## 2015-05-10 LAB — OB RESULTS CONSOLE GC/CHLAMYDIA: GC PROBE AMP, GENITAL: NEGATIVE

## 2015-05-10 LAB — POCT FERN TEST: POCT Fern Test: NEGATIVE

## 2015-05-10 NOTE — Discharge Instructions (Signed)
Fetal Movement Counts  Patient Name: __________________________________________________ Patient Due Date: ____________________  Performing a fetal movement count is highly recommended in high-risk pregnancies, but it is good for every pregnant woman to do. Your health care provider may ask you to start counting fetal movements at 28 weeks of the pregnancy. Fetal movements often increase:  · After eating a full meal.  · After physical activity.  · After eating or drinking something sweet or cold.  · At rest.  Pay attention to when you feel the baby is most active. This will help you notice a pattern of your baby's sleep and wake cycles and what factors contribute to an increase in fetal movement. It is important to perform a fetal movement count at the same time each day when your baby is normally most active.   HOW TO COUNT FETAL MOVEMENTS  1. Find a quiet and comfortable area to sit or lie down on your left side. Lying on your left side provides the best blood and oxygen circulation to your baby.  2. Write down the day and time on a sheet of paper or in a journal.  3. Start counting kicks, flutters, swishes, rolls, or jabs in a 2-hour period. You should feel at least 10 movements within 2 hours.  4. If you do not feel 10 movements in 2 hours, wait 2-3 hours and count again. Look for a change in the pattern or not enough counts in 2 hours.  SEEK MEDICAL CARE IF:  · You feel less than 10 counts in 2 hours, tried twice.  · There is no movement in over an hour.  · The pattern is changing or taking longer each day to reach 10 counts in 2 hours.  · You feel the baby is not moving as he or she usually does.  Date: ____________ Movements: ____________ Start time: ____________ Finish time: ____________   Date: ____________ Movements: ____________ Start time: ____________ Finish time: ____________  Date: ____________ Movements: ____________ Start time: ____________ Finish time: ____________  Date: ____________ Movements:  ____________ Start time: ____________ Finish time: ____________  Date: ____________ Movements: ____________ Start time: ____________ Finish time: ____________  Date: ____________ Movements: ____________ Start time: ____________ Finish time: ____________  Date: ____________ Movements: ____________ Start time: ____________ Finish time: ____________  Date: ____________ Movements: ____________ Start time: ____________ Finish time: ____________   Date: ____________ Movements: ____________ Start time: ____________ Finish time: ____________  Date: ____________ Movements: ____________ Start time: ____________ Finish time: ____________  Date: ____________ Movements: ____________ Start time: ____________ Finish time: ____________  Date: ____________ Movements: ____________ Start time: ____________ Finish time: ____________  Date: ____________ Movements: ____________ Start time: ____________ Finish time: ____________  Date: ____________ Movements: ____________ Start time: ____________ Finish time: ____________  Date: ____________ Movements: ____________ Start time: ____________ Finish time: ____________   Date: ____________ Movements: ____________ Start time: ____________ Finish time: ____________  Date: ____________ Movements: ____________ Start time: ____________ Finish time: ____________  Date: ____________ Movements: ____________ Start time: ____________ Finish time: ____________  Date: ____________ Movements: ____________ Start time: ____________ Finish time: ____________  Date: ____________ Movements: ____________ Start time: ____________ Finish time: ____________  Date: ____________ Movements: ____________ Start time: ____________ Finish time: ____________  Date: ____________ Movements: ____________ Start time: ____________ Finish time: ____________   Date: ____________ Movements: ____________ Start time: ____________ Finish time: ____________  Date: ____________ Movements: ____________ Start time: ____________ Finish  time: ____________  Date: ____________ Movements: ____________ Start time: ____________ Finish time: ____________  Date: ____________ Movements: ____________ Start time:   ____________ Finish time: ____________  Date: ____________ Movements: ____________ Start time: ____________ Finish time: ____________  Date: ____________ Movements: ____________ Start time: ____________ Finish time: ____________  Date: ____________ Movements: ____________ Start time: ____________ Finish time: ____________   Date: ____________ Movements: ____________ Start time: ____________ Finish time: ____________  Date: ____________ Movements: ____________ Start time: ____________ Finish time: ____________  Date: ____________ Movements: ____________ Start time: ____________ Finish time: ____________  Date: ____________ Movements: ____________ Start time: ____________ Finish time: ____________  Date: ____________ Movements: ____________ Start time: ____________ Finish time: ____________  Date: ____________ Movements: ____________ Start time: ____________ Finish time: ____________  Date: ____________ Movements: ____________ Start time: ____________ Finish time: ____________   Date: ____________ Movements: ____________ Start time: ____________ Finish time: ____________  Date: ____________ Movements: ____________ Start time: ____________ Finish time: ____________  Date: ____________ Movements: ____________ Start time: ____________ Finish time: ____________  Date: ____________ Movements: ____________ Start time: ____________ Finish time: ____________  Date: ____________ Movements: ____________ Start time: ____________ Finish time: ____________  Date: ____________ Movements: ____________ Start time: ____________ Finish time: ____________  Date: ____________ Movements: ____________ Start time: ____________ Finish time: ____________   Date: ____________ Movements: ____________ Start time: ____________ Finish time: ____________  Date: ____________  Movements: ____________ Start time: ____________ Finish time: ____________  Date: ____________ Movements: ____________ Start time: ____________ Finish time: ____________  Date: ____________ Movements: ____________ Start time: ____________ Finish time: ____________  Date: ____________ Movements: ____________ Start time: ____________ Finish time: ____________  Date: ____________ Movements: ____________ Start time: ____________ Finish time: ____________  Date: ____________ Movements: ____________ Start time: ____________ Finish time: ____________   Date: ____________ Movements: ____________ Start time: ____________ Finish time: ____________  Date: ____________ Movements: ____________ Start time: ____________ Finish time: ____________  Date: ____________ Movements: ____________ Start time: ____________ Finish time: ____________  Date: ____________ Movements: ____________ Start time: ____________ Finish time: ____________  Date: ____________ Movements: ____________ Start time: ____________ Finish time: ____________  Date: ____________ Movements: ____________ Start time: ____________ Finish time: ____________     This information is not intended to replace advice given to you by your health care provider. Make sure you discuss any questions you have with your health care provider.     Document Released: 03/18/2006 Document Revised: 03/09/2014 Document Reviewed: 12/14/2011  Elsevier Interactive Patient Education ©2016 [employer]

## 2015-05-10 NOTE — MAU Provider Note (Signed)
MAU HISTORY AND PHYSICAL  Chief Complaint:  Decreased fetal movement  Angela DoffingBarbara Maldonado is a 30 y.o.  [id number] with IUP at 4257w1d presenting for decreased fetal movement. for past 3 days. Feeling baby move but less than normal. Cites discharge throughout pregnancy, no increase in amount or frequency. No fever or chills, no dysuria or hematuria, no recent sex. No contractions, no bleeding.   Also wants to be evaluated for leaking fluid. Says was told in a previous pregnancy that she had no fluid around the baby and says she assumes that's because she was leaking and didn't know it. Denies leakage of fluid, just discharge as above.   Past Medical History  Diagnosis Date  . Gestational diabetes   . Vaginal Pap smear, abnormal     colpo, ok since    Past Surgical History  Procedure Laterality Date  . Induced abortion      x2  . Colposcopy      Family History  Problem Relation Age of Onset  . Diabetes Mother   . Alcohol abuse Father   . Diabetes Sister   . Diabetes Maternal Grandmother   . Hypertension Maternal Grandmother   . Stroke Maternal Grandmother     Social History  Substance Use Topics  . Smoking status: Current Every Day Smoker -- 0.25 packs/day for 5 years    Types: Cigarettes  . Smokeless tobacco: Never Used  . Alcohol Use: No    No Known Allergies  Prescriptions prior to admission  Medication Sig Dispense Refill Last Dose  . calcium carbonate (TUMS - DOSED IN MG ELEMENTAL CALCIUM) 500 MG chewable tablet Chew 2 tablets by mouth 2 (two) times daily as needed for indigestion or heartburn.    05/10/2015 at Unknown time  . cyclobenzaprine (FLEXERIL) 10 MG tablet Take 1 tablet (10 mg total) by mouth at bedtime. (Patient not taking: Reported on 05/10/2015) 20 tablet 0   . fluconazole (DIFLUCAN) 150 MG tablet Take 1 tablet (150 mg total) by mouth once. (Patient not taking: Reported on 04/23/2015) 1 tablet 1 Completed Course at Unknown time  . metroNIDAZOLE (FLAGYL) 500 MG tablet  Take 1 tablet (500 mg total) by mouth 2 (two) times daily. (Patient not taking: Reported on 04/09/2015) 14 tablet 0 Not Taking  . Prenatal Multivit-Min-Fe-FA (PRENATAL VITAMINS) 0.8 MG tablet Take 1 tablet by mouth daily. (Patient not taking: Reported on 04/23/2015) 30 tablet 12 Not Taking  . terconazole (TERAZOL 7) 0.4 % vaginal cream Place 1 applicator vaginally at bedtime. (Patient not taking: Reported on 04/23/2015) 45 g 0 Completed Course at Unknown time    Review of Systems - Negative except for what is mentioned in HPI.  Physical Exam  Blood pressure 133/76, pulse 104, temperature 97.9 F (36.6 C), resp. rate 18, height 5\' 5"  (1.651 m), weight 213 lb (96.616 kg), unknown if currently breastfeeding. GENERAL: Well-developed, well-nourished female in no acute distress.  LUNGS: Clear to auscultation bilaterally.  HEART: Regular rate and rhythm. ABDOMEN: Soft, nontender, nondistended, gravid.  EXTREMITIES: Nontender, no edema, 2+ distal pulses. Cervical Exam: 1.5/30/-3 vertex. Normal vulva and cervix, white discharge at posterior fornix FHT:  145/mod/+a/-d Contractions: none  AFI: 14.98, SDP 4.52   Labs: Results for orders placed or performed during the hospital encounter of 05/10/15 (from the past 24 hour(s))  Urinalysis, Routine w reflex microscopic (not [facility])   Collection Time: 05/10/15 10:40 PM  Result Value Ref Range   Color, Urine YELLOW YELLOW   APPearance HAZY (A) CLEAR  Specific Gravity, Urine 1.010 1.005 - 1.030   pH 6.5 5.0 - 8.0   Glucose, UA NEGATIVE NEGATIVE mg/dL   Hgb urine dipstick TRACE (A) NEGATIVE   Bilirubin Urine NEGATIVE NEGATIVE   Ketones, ur NEGATIVE NEGATIVE mg/dL   Protein, ur NEGATIVE NEGATIVE mg/dL   Nitrite NEGATIVE NEGATIVE   Leukocytes, UA TRACE (A) NEGATIVE  Urine microscopic-add on   Collection Time: 05/10/15 10:40 PM  Result Value Ref Range   Squamous Epithelial / LPF 0-5 (A) NONE SEEN   WBC, UA 0-5 0 - 5 WBC/hpf   RBC / HPF NONE SEEN 0 -  5 RBC/hpf   Bacteria, UA FEW (A) NONE SEEN   Urine-Other AMORPHOUS URATES/PHOSPHATES   Fern Test   Collection Time: 05/10/15 11:12 PM  Result Value Ref Range   POCT Fern Test Negative = intact amniotic membranes     Imaging Studies:  No results found.  Assessment/Plan: Angela Maldonado is  30 y.o. [id number] at [redacted]w[redacted]d presents with...  # Decreased fetal movement: here reactive nst and normal afi so normal modified bpp. Instructed in kick counts and ordering outpatient u/s to eval growth.  # Evaluation for ROM - explained that very unlikely membranes ruptured if no increase in vaginal discharge and no leakage of fluid. NST reactive, AFI wnl, no pooling, and fern negative, so do not think membranes ruptured and this explained to patient. Wet prep collected but patient says "I know that's positive - it's always positive - and I don't want to wait for the results." no uti symptoms and ua not suggestive of infection. - pprom return precautions - f/u wet and G/C   Angela Maldonado 3/10/201711:15 PM

## 2015-05-10 NOTE — MAU Note (Signed)
Last appt was Tuesday. Since then have not had any contactions which is odd. Not having as much baby movement as usual for 2 days. Think i am loosing mucous plug since yesteday. With my first 2 babies, i leaked fld the entire pregnancy and told the doctors and they said i was fine. Then at the end of my first pregnancy my fld was low so i would like to be checked for leakage of fld. Have a lot of discharge that is watery. Wear a panty liner everyday

## 2015-05-13 LAB — GC/CHLAMYDIA PROBE AMP (~~LOC~~) NOT AT ARMC
CHLAMYDIA, DNA PROBE: NEGATIVE
NEISSERIA GONORRHEA: NEGATIVE

## 2015-05-14 ENCOUNTER — Telehealth (HOSPITAL_COMMUNITY): Payer: Self-pay | Admitting: *Deleted

## 2015-05-14 ENCOUNTER — Encounter: Payer: Self-pay | Admitting: Family

## 2015-05-14 ENCOUNTER — Ambulatory Visit (INDEPENDENT_AMBULATORY_CARE_PROVIDER_SITE_OTHER): Payer: Self-pay | Admitting: Family

## 2015-05-14 VITALS — BP 130/81 | HR 103 | Temp 98.1°F | Wt 212.0 lb

## 2015-05-14 DIAGNOSIS — B951 Streptococcus, group B, as the cause of diseases classified elsewhere: Secondary | ICD-10-CM

## 2015-05-14 DIAGNOSIS — Z3483 Encounter for supervision of other normal pregnancy, third trimester: Secondary | ICD-10-CM

## 2015-05-14 NOTE — Patient Instructions (Signed)
[employer] 

## 2015-05-14 NOTE — Telephone Encounter (Signed)
Preadmission screen  

## 2015-05-14 NOTE — Progress Notes (Signed)
Subjective:  Angela Maldonado is a 30 y.o. [phone] at 123w5d being seen today for ongoing prenatal care.  She is currently monitored for the following issues for this low-risk pregnancy and has Supervision of normal subsequent pregnancy and Abnormal glucose tolerance affecting pregnancy, antepartum on her problem list.  Patient reports no complaints.  Contractions: Irritability. Vag. Bleeding: None.  Movement: Present. Denies leaking of fluid.   The following portions of the patient's history were reviewed and updated as appropriate: allergies, current medications, past family history, past medical history, past social history, past surgical history and problem list. Problem list updated.  Objective:   Filed Vitals:   05/14/15 1108  BP: 130/81  Pulse: 103  Temp: 98.1 F (36.7 C)  Weight: 212 lb (96.163 kg)    Fetal Status: Fetal Heart Rate (bpm): 146 Fundal Height: 38 cm Movement: Present  Presentation: Vertex  General:  Alert, oriented and cooperative. Patient is in no acute distress.  Skin: Skin is warm and dry. No rash noted.   Cardiovascular: Normal heart rate noted  Respiratory: Normal respiratory effort, no problems with respiration noted  Abdomen: Soft, gravid, appropriate for gestational age. Pain/Pressure: Present     Pelvic: Vag. Bleeding: None     Cervical exam deferred Dilation: 1.5 Effacement (%): Thick    Extremities: Normal range of motion.  Edema: Trace  Mental Status: Normal mood and affect. Normal behavior. Normal judgment and thought content.   Urinalysis: Urine Protein: 1+ Urine Glucose: Negative  Assessment and Plan:  Pregnancy: A5W0981G5P2022 at 4823w5d  1. Positive GBS test - Antibiotics in labor  2. Encounter for supervision of other normal pregnancy in third trimester - Plan to schedule IOL at midnight on 04/24/15 for 2/23 induction start - NST on Monday  Term labor symptoms and general obstetric precautions including but not limited to vaginal bleeding,  contractions, leaking of fluid and fetal movement were reviewed in detail with the patient. Please refer to After Visit Summary for other counseling recommendations.  Return on 3/20 for NST.   Eino FarberWalidah Kennith GainN Karim, CNM

## 2015-05-20 ENCOUNTER — Encounter: Payer: Self-pay | Admitting: Obstetrics and Gynecology

## 2015-05-20 ENCOUNTER — Ambulatory Visit (INDEPENDENT_AMBULATORY_CARE_PROVIDER_SITE_OTHER): Payer: Self-pay | Admitting: Obstetrics and Gynecology

## 2015-05-20 ENCOUNTER — Encounter: Payer: Self-pay | Admitting: Family

## 2015-05-20 VITALS — BP 118/80 | HR 101 | Wt 213.6 lb

## 2015-05-20 DIAGNOSIS — Z3483 Encounter for supervision of other normal pregnancy, third trimester: Secondary | ICD-10-CM

## 2015-05-20 DIAGNOSIS — O99810 Abnormal glucose complicating pregnancy: Secondary | ICD-10-CM

## 2015-05-20 DIAGNOSIS — O480 Post-term pregnancy: Secondary | ICD-10-CM

## 2015-05-20 LAB — POCT URINALYSIS DIP (DEVICE)
BILIRUBIN URINE: NEGATIVE
GLUCOSE, UA: NEGATIVE mg/dL
KETONES UR: NEGATIVE mg/dL
Nitrite: NEGATIVE
Protein, ur: NEGATIVE mg/dL
SPECIFIC GRAVITY, URINE: 1.02 (ref 1.005–1.030)
Urobilinogen, UA: 0.2 mg/dL (ref 0.0–1.0)
pH: 7 (ref 5.0–8.0)

## 2015-05-20 NOTE — Progress Notes (Signed)
Subjective:  Angela DoffingBarbara Maldonado is a 30 y.o. [id number] at 7378w4d being seen today for ongoing prenatal care.  She is currently monitored for the following issues for this low-risk pregnancy and has Supervision of normal subsequent pregnancy and Abnormal glucose tolerance affecting pregnancy, antepartum on her problem list.  Patient reports no complaints.  Contractions: Irritability. Vag. Bleeding: None.  Movement: Present. Denies leaking of fluid.   The following portions of the patient's history were reviewed and updated as appropriate: allergies, current medications, past family history, past medical history, past social history, past surgical history and problem list. Problem list updated.  Objective:   Filed Vitals:   05/20/15 1107  BP: 118/80  Pulse: 101  Weight: 213 lb 9.6 oz (96.888 kg)    Fetal Status: Fetal Heart Rate (bpm): NST Fundal Height: 41 cm Movement: Present  Presentation: Vertex  General:  Alert, oriented and cooperative. Patient is in no acute distress.  Skin: Skin is warm and dry. No rash noted.   Cardiovascular: Normal heart rate noted  Respiratory: Normal respiratory effort, no problems with respiration noted  Abdomen: Soft, gravid, appropriate for gestational age. Pain/Pressure: Present     Pelvic: Vag. Bleeding: None     Cervical exam performed Dilation: 1.5 Effacement (%): Thick    Extremities: Normal range of motion.  Edema: Trace  Mental Status: Normal mood and affect. Normal behavior. Normal judgment and thought content.   Urinalysis: Urine Protein: Negative Urine Glucose: Negative  Assessment and Plan:  Pregnancy: A5W0981G5P2022 at 6878w4d  1. Abnormal glucose tolerance affecting pregnancy, antepartum   2. Encounter for supervision of other normal pregnancy in third trimester Patient is doing well Scheduled for IOL on 3/22 NST reviewed and reactive  Term labor symptoms and general obstetric precautions including but not limited to vaginal bleeding, contractions,  leaking of fluid and fetal movement were reviewed in detail with the patient. Please refer to After Visit Summary for other counseling recommendations.  Return in about 6 weeks (around 07/01/2015) for pp.   Catalina AntiguaPeggy Margareth Kanner, MD

## 2015-05-21 ENCOUNTER — Other Ambulatory Visit: Payer: Self-pay

## 2015-05-22 ENCOUNTER — Encounter (HOSPITAL_COMMUNITY): Payer: Self-pay

## 2015-05-22 ENCOUNTER — Inpatient Hospital Stay (HOSPITAL_COMMUNITY): Payer: Medicaid Other | Admitting: Anesthesiology

## 2015-05-22 ENCOUNTER — Inpatient Hospital Stay (HOSPITAL_COMMUNITY)
Admission: RE | Admit: 2015-05-22 | Discharge: 2015-05-25 | DRG: 767 | Disposition: A | Discharge status: Home / Self Care | Payer: Medicaid Other | Source: Ambulatory Visit | Attending: Obstetrics and Gynecology | Admitting: Obstetrics and Gynecology

## 2015-05-22 VITALS — BP 134/84 | HR 103 | Temp 98.3°F | Resp 20 | Ht 64.0 in | Wt 213.0 lb

## 2015-05-22 DIAGNOSIS — O99334 Smoking (tobacco) complicating childbirth: Secondary | ICD-10-CM | POA: Diagnosis present

## 2015-05-22 DIAGNOSIS — O99824 Streptococcus B carrier state complicating childbirth: Secondary | ICD-10-CM | POA: Diagnosis present

## 2015-05-22 DIAGNOSIS — Z302 Encounter for sterilization: Secondary | ICD-10-CM | POA: Diagnosis present

## 2015-05-22 DIAGNOSIS — Z833 Family history of diabetes mellitus: Secondary | ICD-10-CM

## 2015-05-22 DIAGNOSIS — F17210 Nicotine dependence, cigarettes, uncomplicated: Secondary | ICD-10-CM | POA: Diagnosis present

## 2015-05-22 DIAGNOSIS — Z823 Family history of stroke: Secondary | ICD-10-CM

## 2015-05-22 DIAGNOSIS — Z3483 Encounter for supervision of other normal pregnancy, third trimester: Secondary | ICD-10-CM

## 2015-05-22 DIAGNOSIS — O480 Post-term pregnancy: Principal | ICD-10-CM | POA: Diagnosis present

## 2015-05-22 DIAGNOSIS — Z3A40 40 weeks gestation of pregnancy: Secondary | ICD-10-CM

## 2015-05-22 DIAGNOSIS — O99814 Abnormal glucose complicating childbirth: Secondary | ICD-10-CM | POA: Diagnosis present

## 2015-05-22 DIAGNOSIS — Z8249 Family history of ischemic heart disease and other diseases of the circulatory system: Secondary | ICD-10-CM

## 2015-05-22 LAB — CBC
HEMATOCRIT: 30.2 % — AB (ref 36.0–46.0)
HEMOGLOBIN: 9.8 g/dL — AB (ref 12.0–15.0)
MCH: 27.1 pg (ref 26.0–34.0)
MCHC: 32.5 g/dL (ref 30.0–36.0)
MCV: 83.4 fL (ref 78.0–100.0)
Platelets: 236 10*3/uL (ref 150–400)
RBC: 3.62 MIL/uL — ABNORMAL LOW (ref 3.87–5.11)
RDW: 14.6 % (ref 11.5–15.5)
WBC: 10.3 10*3/uL (ref 4.0–10.5)

## 2015-05-22 LAB — TYPE AND SCREEN
ABO/RH(D): O POS
ANTIBODY SCREEN: NEGATIVE

## 2015-05-22 LAB — RPR: RPR Ser Ql: NONREACTIVE

## 2015-05-22 LAB — ABO/RH: ABO/RH(D): O POS

## 2015-05-22 MED ORDER — LIDOCAINE HCL (PF) 1 % IJ SOLN
INTRAMUSCULAR | Status: DC | PRN
  Administered 2015-05-22: 8 mL via EPIDURAL
  Administered 2015-05-22: 7 mL via EPIDURAL

## 2015-05-22 MED ORDER — EPHEDRINE 5 MG/ML INJ
10.0000 mg | INTRAVENOUS | Status: DC | PRN
  Filled 2015-05-22: qty 2

## 2015-05-22 MED ORDER — OXYTOCIN 10 UNIT/ML IJ SOLN
1.0000 m[IU]/min | INTRAMUSCULAR | Status: DC
  Administered 2015-05-22: 2 m[IU]/min via INTRAVENOUS
  Filled 2015-05-22: qty 10

## 2015-05-22 MED ORDER — OXYTOCIN 10 UNIT/ML IJ SOLN: 2.5000 [IU]/h | INTRAMUSCULAR | Status: DC

## 2015-05-22 MED ORDER — OXYTOCIN BOLUS FROM INFUSION
500.0000 mL | INTRAVENOUS | Status: DC
  Administered 2015-05-23: 500 mL via INTRAVENOUS

## 2015-05-22 MED ORDER — ACETAMINOPHEN 325 MG PO TABS
650.0000 mg | ORAL_TABLET | ORAL | Status: DC | PRN
  Administered 2015-05-22: 650 mg via ORAL
  Filled 2015-05-22: qty 2

## 2015-05-22 MED ORDER — PHENYLEPHRINE 40 MCG/ML (10ML) SYRINGE FOR IV PUSH (FOR BLOOD PRESSURE SUPPORT)
80.0000 ug | PREFILLED_SYRINGE | INTRAVENOUS | Status: DC | PRN
  Filled 2015-05-22: qty 2

## 2015-05-22 MED ORDER — LACTATED RINGERS IV SOLN
500.0000 mL | INTRAVENOUS | Status: DC | PRN
  Administered 2015-05-22 – 2015-05-23 (×4): 500 mL via INTRAVENOUS

## 2015-05-22 MED ORDER — FENTANYL 2.5 MCG/ML BUPIVACAINE 1/10 % EPIDURAL INFUSION (WH - ANES)
14.0000 mL/h | INTRAMUSCULAR | Status: DC | PRN
  Administered 2015-05-22 – 2015-05-23 (×3): 14 mL/h via EPIDURAL
  Filled 2015-05-22 (×3): qty 125

## 2015-05-22 MED ORDER — TERBUTALINE SULFATE 1 MG/ML IJ SOLN: 0.2500 mg | Freq: Once | INTRAMUSCULAR | Status: DC | PRN

## 2015-05-22 MED ORDER — LIDOCAINE HCL (PF) 1 % IJ SOLN
30.0000 mL | INTRAMUSCULAR | Status: DC | PRN
  Filled 2015-05-22: qty 30

## 2015-05-22 MED ORDER — LACTATED RINGERS IV SOLN
INTRAVENOUS | Status: DC
Start: 2015-05-22 — End: 2015-05-23
  Administered 2015-05-22 – 2015-05-23 (×5): via INTRAVENOUS

## 2015-05-22 MED ORDER — TERBUTALINE SULFATE 1 MG/ML IJ SOLN
0.2500 mg | Freq: Once | INTRAMUSCULAR | Status: DC | PRN
  Filled 2015-05-22: qty 1

## 2015-05-22 MED ORDER — CITRIC ACID-SODIUM CITRATE 334-500 MG/5ML PO SOLN
30.0000 mL | ORAL | Status: DC | PRN
  Filled 2015-05-22: qty 15

## 2015-05-22 MED ORDER — LACTATED RINGERS IV SOLN: INTRAVENOUS | Status: DC

## 2015-05-22 MED ORDER — ZOLPIDEM TARTRATE 5 MG PO TABS
5.0000 mg | ORAL_TABLET | Freq: Every evening | ORAL | Status: DC | PRN
  Administered 2015-05-22: 5 mg via ORAL
  Filled 2015-05-22: qty 1

## 2015-05-22 MED ORDER — FENTANYL CITRATE (PF) 100 MCG/2ML IJ SOLN
50.0000 ug | INTRAMUSCULAR | Status: DC | PRN
  Administered 2015-05-22 (×2): 100 ug via INTRAVENOUS
  Filled 2015-05-22 (×3): qty 2

## 2015-05-22 MED ORDER — LACTATED RINGERS IV SOLN
500.0000 mL | Freq: Once | INTRAVENOUS | Status: AC
  Administered 2015-05-22: 1000 mL via INTRAVENOUS

## 2015-05-22 MED ORDER — MISOPROSTOL 25 MCG QUARTER TABLET
25.0000 ug | ORAL_TABLET | ORAL | Status: DC | PRN
  Administered 2015-05-22: 25 ug via VAGINAL
  Filled 2015-05-22: qty 0.25

## 2015-05-22 MED ORDER — DIPHENHYDRAMINE HCL 50 MG/ML IJ SOLN: 12.5000 mg | INTRAMUSCULAR | Status: DC | PRN

## 2015-05-22 MED ORDER — BUPIVACAINE HCL (PF) 0.25 % IJ SOLN
INTRAMUSCULAR | Status: DC | PRN
  Administered 2015-05-22: 3 mL via EPIDURAL

## 2015-05-22 MED ORDER — SODIUM BICARBONATE 8.4 % IV SOLN
INTRAVENOUS | Status: DC | PRN
  Administered 2015-05-22: 3 mL via EPIDURAL

## 2015-05-22 MED ORDER — LACTATED RINGERS IV SOLN
INTRAVENOUS | Status: DC
  Administered 2015-05-22: 23:00:00 via INTRAUTERINE

## 2015-05-22 MED ORDER — PENICILLIN G POTASSIUM 5000000 UNITS IJ SOLR
5.0000 10*6.[IU] | Freq: Once | INTRAVENOUS | Status: AC
  Administered 2015-05-22: 5 10*6.[IU] via INTRAVENOUS
  Filled 2015-05-22: qty 5

## 2015-05-22 MED ORDER — PENICILLIN G POTASSIUM 5000000 UNITS IJ SOLR
2.5000 10*6.[IU] | INTRAMUSCULAR | Status: DC
  Administered 2015-05-22 – 2015-05-23 (×5): 2.5 10*6.[IU] via INTRAVENOUS
  Filled 2015-05-22 (×10): qty 2.5

## 2015-05-22 MED ORDER — OXYCODONE-ACETAMINOPHEN 5-325 MG PO TABS: 2.0000 | ORAL_TABLET | ORAL | Status: DC | PRN

## 2015-05-22 MED ORDER — ONDANSETRON HCL 4 MG/2ML IJ SOLN: 4.0000 mg | Freq: Four times a day (QID) | INTRAMUSCULAR | Status: DC | PRN

## 2015-05-22 MED ORDER — PHENYLEPHRINE 40 MCG/ML (10ML) SYRINGE FOR IV PUSH (FOR BLOOD PRESSURE SUPPORT)
80.0000 ug | PREFILLED_SYRINGE | INTRAVENOUS | Status: DC | PRN
  Filled 2015-05-22: qty 2
  Filled 2015-05-22 (×2): qty 20

## 2015-05-22 MED ORDER — OXYCODONE-ACETAMINOPHEN 5-325 MG PO TABS: 1.0000 | ORAL_TABLET | ORAL | Status: DC | PRN

## 2015-05-22 NOTE — Progress Notes (Signed)
Angela DoffingBarbara Maldonado is a 30 y.o. [id number] at 741w6d  admitted for induction of labor due to Post dates.   Subjective: Feeling more rectal pressure; was redosed recently  Objective: BPs: 128/80, 132/79 BP 134/108 mmHg  Pulse 106  Temp(Src) 98.6 F (37 C) (Oral)  Resp 18  Ht 5\' 4"  (1.626 m)  Wt 96.616 kg (213 lb)  BMI 36.54 kg/m2  SpO2 98%  LMP  (LMP Unknown)      FHT:  FHR: 140s bpm, variability: moderate,  accelerations:  Present,  decelerations:  Absent UC:   regular, every 2 minutes w/ Pit off due to FHR variables earlier SVE:   Dilation: 8 Effacement (%): 80, 90 Station: -1 Exam by:: e poore, rn  Labs: Lab Results  Component Value Date   WBC 10.3 05/22/2015   HGB 9.8* 05/22/2015   HCT 30.2* 05/22/2015   MCV 83.4 05/22/2015   PLT 236 05/22/2015    Assessment / Plan: IUP@term  Active labor  Anesthesia to manage pt comfort Anticipate SVD   Cam HaiSHAW, KIMBERLY CNM 05/22/2015, 10:19 PM

## 2015-05-22 NOTE — H&P (Signed)
LABOR ADMISSION HISTORY AND PHYSICAL  Angela Maldonado is a 30 y.o. female [phone] with IUP at [redacted]w[redacted]d by 19wk [place] presenting for IOL 2/2 post-dates. She reports +FM, + contractions, No LOF, no VB, no blurry vision, headaches or peripheral edema, and RUQ pain.  She plans on bottle feeding. She request BTL for birth control.  Dating: By 19wk [place] --->  Estimated Date of Delivery: 05/16/15  Sono:   , CWD, normal anatomy, cephalic presentation, posterior placenta, cervical length 3.7, 899g, 73% EFW   Prenatal History/Complications: LRC GBS positive Abnormal glucose tolerance early 94, 183 > 3hr gtt normal   Past Medical History: Past Medical History  Diagnosis Date  . Gestational diabetes   . Vaginal Pap smear, abnormal     colpo, ok since    Past Surgical History: Past Surgical History  Procedure Laterality Date  . Induced abortion      x2  . Colposcopy      Obstetrical History: OB History    Gravida Para Term Preterm AB TAB SAB Ectopic Multiple Living   0 2 2   0 2      Obstetric Comments   Induced, gest diab- diet controlled      Social History: Social History   Social History  . Marital Status: Single    Spouse Name: N/A  . Number of Children: N/A  . Years of Education: N/A   Social History Main Topics  . Smoking status: Current Every Day Smoker -- 0.25 packs/day for 5 years    Types: Cigarettes  . Smokeless tobacco: Never Used  . Alcohol Use: No  . Drug Use: No  . Sexual Activity: Yes    Birth Control/ Protection: None     Comment: Had been off the pill briefly   Other Topics Concern  . Not on file   Social History Narrative    Family History: Family History  Problem Relation Age of Onset  . Diabetes Mother   . Alcohol abuse Father   . Diabetes Sister   . Diabetes Maternal Grandmother   . Hypertension Maternal Grandmother   . Stroke Maternal Grandmother     Allergies: No Known Allergies  Prescriptions prior to admission  Medication  Sig Dispense Refill Last Dose  . calcium carbonate (TUMS - DOSED IN MG ELEMENTAL CALCIUM) 500 MG chewable tablet Chew 2 tablets by mouth 2 (two) times daily as needed for indigestion or heartburn.    Taking    Review of Systems  All systems reviewed and negative except as stated in HPI  BP 133/71 mmHg  Pulse 106  Wt 213 lb (96.616 kg)  LMP  (LMP Unknown) General appearance: alert, cooperative and no distress Lungs: normal work of breathing Heart: regular rate Abdomen: soft, gravid, non-tender Pelvic: normal Extremities: Homans sign is negative, no sign of DVT, edema Presentation: cephalic Fetal monitoring: Baseline: 140 bpm, Variability: Good {> 6 bpm), Accelerations: Reactive and Decelerations: Absent Uterine activity: irregular  Dilation: 1.5 Effacement (%): Thick Exam by:: Dr. Doroteo Glassman  Prenatal labs: ABO, Rh: O/POS/-- (11/01 1357) Antibody: NEG (11/01 1357) Rubella: Immune RPR: NON REAC (02/07 1108)  HBsAg: NEGATIVE (11/01 1357)  HIV: NONREACTIVE (02/07 1108)  GBS: Positive (02/15 0000)  1 hr Glucola 183, passed 3hr Genetic screening  Quad negative Anatomy US normal  Prenatal Transfer Tool  Maternal Diabetes: No Genetic Screening: Normal Maternal Ultrasounds/Referrals: Normal Fetal Ultrasounds or other Referrals:  None Maternal Substance Abuse:  No Significant Maternal Medications:  None Significant  Maternal Lab Results: Lab values include: Group B Strep positive  No results found for this or any previous visit (from the past 24 hour(s)).  Patient Active Problem List   Diagnosis Date Noted  . Abnormal glucose tolerance affecting pregnancy, antepartum 04/17/2015  . Supervision of normal subsequent pregnancy 03/05/2015    Assessment: Angela Maldonado is a 30 y.o. [id number] at 3965w6d here for IOL 2/2 post-dates.  [facility] #Labor: IOL with Cytotec. #Pain: Plan for epidural #FWB: Category 1 #ID: GBS positive > PCN #MOF: Breast/bottle #MOC: BTL -  consent signed at 7934 wks #Circ: female; outpatient circ  Caryl AdaJazma Phelps, DO 05/22/2015, 12:47 AM PGY-2, [facility]  I was present for the exam and agree with above. Foley bulb and Cytotec placed. Placement verified by CNM.   [place] Vernica Wachtel, [place] 05/22/2015 3:42 AM

## 2015-05-22 NOTE — Progress Notes (Signed)
Patient ID: Angela DoffingBarbara Mittleman, female   DOB: 07/19/1985, 30 y.o.   MRN: [id number]  CTSP for FHR variables to 90s; +accels Spont ctx q 2-4 mins Cx remains 8cm IUPC inserted and amnioinfusion started Will watch FHR and eval MVUs to determine need for Amalia Greenhouseit  SHAW, [facility] CampusKIMBERLY 05/22/2015 11:28 PM

## 2015-05-22 NOTE — Anesthesia Procedure Notes (Signed)
Epidural Patient location during procedure: OB Start time: 05/22/2015 3:14 PM End time: 05/22/2015 3:18 PM  Staffing Anesthesiologist: Leilani AbleHATCHETT, Pang Robers Performed by: anesthesiologist   Preanesthetic Checklist Completed: patient identified, surgical consent, pre-op evaluation, timeout performed, IV checked, risks and benefits discussed and monitors and equipment checked  Epidural Patient position: sitting Prep: site prepped and draped and DuraPrep Patient monitoring: continuous pulse ox and blood pressure Approach: midline Location: L3-L4 Injection technique: LOR air  Needle:  Needle type: Tuohy  Needle gauge: 17 G Needle length: 9 cm and 9 Needle insertion depth: 5 cm cm Catheter type: closed end flexible Catheter size: 19 Gauge Catheter at skin depth: 10 cm Test dose: negative and Other  Assessment Sensory level: T9 Events: blood not aspirated, injection not painful, no injection resistance, negative IV test and no paresthesia  Additional Notes Reason for block:procedure for pain

## 2015-05-22 NOTE — Anesthesia Preprocedure Evaluation (Signed)
Anesthesia Evaluation  Patient identified by MRN, date of birth, ID band Patient awake    Reviewed: Allergy & Precautions, H&P , NPO status , Patient's Chart, lab work & pertinent test results  Airway Mallampati: II  TM Distance: >3 FB Neck ROM: full    Dental no notable dental hx.    Pulmonary Current Smoker,    Pulmonary exam normal        Cardiovascular negative cardio ROS Normal cardiovascular exam     Neuro/Psych negative neurological ROS  negative psych ROS   GI/Hepatic negative GI ROS, Neg liver ROS,   Endo/Other  diabetes, Gestational  Renal/GU negative Renal ROS     Musculoskeletal   Abdominal (+) + obese,   Peds  Hematology negative hematology ROS (+)   Anesthesia Other Findings   Reproductive/Obstetrics (+) Pregnancy                             Anesthesia Physical Anesthesia Plan  ASA: II  Anesthesia Plan: Epidural   Post-op Pain Management:    Induction:   Airway Management Planned:   Additional Equipment:   Intra-op Plan:   Post-operative Plan:   Informed Consent: I have reviewed the patients History and Physical, chart, labs and discussed the procedure including the risks, benefits and alternatives for the proposed anesthesia with the patient or authorized representative who has indicated his/her understanding and acceptance.     Plan Discussed with:   Anesthesia Plan Comments:         Anesthesia Quick Evaluation

## 2015-05-22 NOTE — Progress Notes (Signed)
Angela Maldonado is a 30 y.o. [id number] at 5561w6d admitted for induction of labor due to postdates.  Subjective: Pt reports cramping/contractions that are irregular but getting stronger.  Family in room for support.  Objective: BP 112/56 mmHg  Pulse 96  Temp(Src) 98.4 F (36.9 C) (Oral)  Resp 18  Ht 5\' 4"  (1.626 m)  Wt 96.616 kg (213 lb)  BMI 36.54 kg/m2  LMP  (LMP Unknown)      FHT:  FHR: 135 bpm, variability: moderate,  accelerations:  Present,  decelerations:  Absent UC:   irregular, every 2-10 minutes SVE:   Dilation: 4 Effacement (%): Thick Exam by:: Gifford ShaveYancey Luft RN  Labs: Lab Results  Component Value Date   WBC 10.3 05/22/2015   HGB 9.8* 05/22/2015   HCT 30.2* 05/22/2015   MCV 83.4 05/22/2015   PLT 236 05/22/2015    Assessment / Plan: Induction of labor due to postdates  Labor: Start Pitocin @ 2 milliunits, increase by 2 milliunits per protocol until adequate labor.  Pt may have epidural when desired. Preeclampsia:  n/a Fetal Wellbeing:  Category I Pain Control:  Labor support without medications I/D:  n/a Anticipated MOD:  NSVD  Angela Maldonado, Angela Maldonado 05/22/2015, 1:36 PM

## 2015-05-22 NOTE — Progress Notes (Signed)
Evette DoffingBarbara Pierpoint is a 30 y.o. [id number] at 257w6d by ultrasound admitted for induction of labor due to Post dates. Due date 05/16/15.  Subjective: Patient resting comfortably in bed. Patient was asleep upon entering the room. Patient states she feels slight pressure with her contractions, but no pain. Patient is sleeping well.  Objective: BP 133/71 mmHg  Pulse 106  Wt 96.616 kg (213 lb)  LMP  (LMP Unknown)  FHT:  FHR: 135 bpm, variability: moderate,  accelerations:  Present,  decelerations:  Absent UC:   regular, every 5-6 minutes SVE:   Dilation: 1.5 Effacement (%): Thick Exam by:: Dr. Doroteo GlassmanPhelps  Labs: Lab Results  Component Value Date   WBC 10.3 05/22/2015   HGB 9.8* 05/22/2015   HCT 30.2* 05/22/2015   MCV 83.4 05/22/2015   PLT 236 05/22/2015    Assessment / Plan: Patient presents for IOL due to post dates; progressing well  Labor: Progressing normally Fetal Wellbeing:  Category I Pain Control:  Epidural (planned) Anticipated MOD:  NSVD  Cephus ShellingAndrew P Jaleia Hanke PA-S 05/22/2015, 5:39 AM

## 2015-05-22 NOTE — Progress Notes (Signed)
Evette DoffingBarbara Brocksmith is a 30 y.o. [id number] at 6552w6d by ultrasound admitted for induction of labor for post dates   Subjective: Patient resting comfortably in bed, is feeling some pressure from contractions.   Objective: BP 122/67 mmHg  Pulse 101  Temp(Src) 97.8 F (36.6 C) (Oral)  Resp 18  Ht 5\' 4"  (1.626 m)  Wt 96.616 kg (213 lb)  BMI 36.54 kg/m2  LMP  (LMP Unknown)      FHT:  FHR: 135 bpm, variability: moderate,  accelerations:  Present,  decelerations:  Absent UC:   regular, every 1-5 minutes SVE:   Dilation: 4 Effacement (%): Thick Exam by:: Gifford ShaveYancey Luft   Labs: Lab Results  Component Value Date   WBC 10.3 05/22/2015   HGB 9.8* 05/22/2015   HCT 30.2* 05/22/2015   MCV 83.4 05/22/2015   PLT 236 05/22/2015    Assessment / Plan: Induction of labor due to postterm,  S/p cytotec and foley bulb  Labor: Progressing normally, will re-check cervix in ~2 hours  Fetal Wellbeing:  Category I Pain Control:  IV pain meds I/D:  GBS pos- PCN Anticipated MOD:  NSVD  Beaulah DinningChristina M Ebonie Westerlund 05/22/2015, 11:46 AM

## 2015-05-23 ENCOUNTER — Encounter (HOSPITAL_COMMUNITY): Payer: Self-pay

## 2015-05-23 ENCOUNTER — Encounter (HOSPITAL_COMMUNITY): Payer: Self-pay | Admitting: Obstetrics and Gynecology

## 2015-05-23 ENCOUNTER — Inpatient Hospital Stay (HOSPITAL_COMMUNITY): Payer: Medicaid Other | Admitting: Anesthesiology

## 2015-05-23 ENCOUNTER — Encounter (HOSPITAL_COMMUNITY)
Admission: RE | Disposition: A | Discharge status: Home / Self Care | Payer: Self-pay | Source: Ambulatory Visit | Attending: Obstetrics and Gynecology

## 2015-05-23 DIAGNOSIS — O99824 Streptococcus B carrier state complicating childbirth: Secondary | ICD-10-CM

## 2015-05-23 DIAGNOSIS — Z302 Encounter for sterilization: Secondary | ICD-10-CM

## 2015-05-23 DIAGNOSIS — Z3A40 40 weeks gestation of pregnancy: Secondary | ICD-10-CM

## 2015-05-23 HISTORY — PX: TUBAL LIGATION: SHX77

## 2015-05-23 SURGERY — LIGATION, FALLOPIAN TUBE, POSTPARTUM
Anesthesia: General | Site: Abdomen | Laterality: Bilateral

## 2015-05-23 MED ORDER — KETOROLAC TROMETHAMINE 30 MG/ML IJ SOLN
INTRAMUSCULAR | Status: DC | PRN
  Administered 2015-05-23: 30 mg via INTRAVENOUS

## 2015-05-23 MED ORDER — SUCCINYLCHOLINE CHLORIDE 20 MG/ML IJ SOLN
INTRAMUSCULAR | Status: DC | PRN
  Administered 2015-05-23: 120 mg via INTRAVENOUS

## 2015-05-23 MED ORDER — MEPERIDINE HCL 25 MG/ML IJ SOLN: 6.2500 mg | INTRAMUSCULAR | Status: DC | PRN

## 2015-05-23 MED ORDER — OXYCODONE HCL 5 MG/5ML PO SOLN: 5.0000 mg | Freq: Once | ORAL | Status: DC | PRN

## 2015-05-23 MED ORDER — HYDROMORPHONE HCL 1 MG/ML IJ SOLN
0.2500 mg | INTRAMUSCULAR | Status: DC | PRN
  Administered 2015-05-23: 0.5 mg via INTRAVENOUS

## 2015-05-23 MED ORDER — GLYCOPYRROLATE 0.2 MG/ML IJ SOLN
INTRAMUSCULAR | Status: DC | PRN
  Administered 2015-05-23: 0.4 mg via INTRAVENOUS

## 2015-05-23 MED ORDER — LANOLIN HYDROUS EX OINT: TOPICAL_OINTMENT | CUTANEOUS | Status: DC | PRN

## 2015-05-23 MED ORDER — OXYCODONE HCL 5 MG PO TABS: 5.0000 mg | ORAL_TABLET | Freq: Once | ORAL | Status: DC | PRN

## 2015-05-23 MED ORDER — LIDOCAINE HCL (CARDIAC) 20 MG/ML IV SOLN
INTRAVENOUS | Status: AC
  Filled 2015-05-23: qty 5

## 2015-05-23 MED ORDER — LIDOCAINE HCL (CARDIAC) 20 MG/ML IV SOLN
INTRAVENOUS | Status: DC | PRN
  Administered 2015-05-23: 100 mg via INTRAVENOUS

## 2015-05-23 MED ORDER — PRENATAL MULTIVITAMIN CH
1.0000 | ORAL_TABLET | Freq: Every day | ORAL | Status: DC
  Administered 2015-05-24 – 2015-05-25 (×2): 1 via ORAL
  Filled 2015-05-23 (×2): qty 1

## 2015-05-23 MED ORDER — NEOSTIGMINE METHYLSULFATE 10 MG/10ML IV SOLN
INTRAVENOUS | Status: AC
  Filled 2015-05-23: qty 1

## 2015-05-23 MED ORDER — GLYCOPYRROLATE 0.2 MG/ML IJ SOLN
INTRAMUSCULAR | Status: AC
  Filled 2015-05-23: qty 2

## 2015-05-23 MED ORDER — PROPOFOL 10 MG/ML IV BOLUS
INTRAVENOUS | Status: AC
  Filled 2015-05-23: qty 20

## 2015-05-23 MED ORDER — ONDANSETRON HCL 4 MG/2ML IJ SOLN
INTRAMUSCULAR | Status: AC
  Filled 2015-05-23: qty 2

## 2015-05-23 MED ORDER — METOCLOPRAMIDE HCL 10 MG PO TABS
10.0000 mg | ORAL_TABLET | Freq: Once | ORAL | Status: AC
Start: 2015-05-23 — End: 2015-05-23
  Administered 2015-05-23: 10 mg via ORAL
  Filled 2015-05-23: qty 1

## 2015-05-23 MED ORDER — SIMETHICONE 80 MG PO CHEW
80.0000 mg | CHEWABLE_TABLET | ORAL | Status: DC | PRN
Start: 2015-05-23 — End: 2015-05-25
  Administered 2015-05-24: 80 mg via ORAL

## 2015-05-23 MED ORDER — ZOLPIDEM TARTRATE 5 MG PO TABS: 5.0000 mg | ORAL_TABLET | Freq: Every evening | ORAL | Status: DC | PRN

## 2015-05-23 MED ORDER — IBUPROFEN 600 MG PO TABS
600.0000 mg | ORAL_TABLET | Freq: Four times a day (QID) | ORAL | Status: DC
  Administered 2015-05-23 – 2015-05-25 (×8): 600 mg via ORAL
  Filled 2015-05-23 (×9): qty 1

## 2015-05-23 MED ORDER — PROMETHAZINE HCL 25 MG/ML IJ SOLN: 6.2500 mg | INTRAMUSCULAR | Status: DC | PRN

## 2015-05-23 MED ORDER — MIDAZOLAM HCL 2 MG/2ML IJ SOLN
INTRAMUSCULAR | Status: AC
  Filled 2015-05-23: qty 2

## 2015-05-23 MED ORDER — LACTATED RINGERS IV SOLN
INTRAVENOUS | Status: DC | PRN
  Administered 2015-05-23: 14:00:00 via INTRAVENOUS

## 2015-05-23 MED ORDER — FENTANYL CITRATE (PF) 100 MCG/2ML IJ SOLN
INTRAMUSCULAR | Status: DC | PRN
  Administered 2015-05-23: 150 ug via INTRAVENOUS

## 2015-05-23 MED ORDER — FENTANYL CITRATE (PF) 250 MCG/5ML IJ SOLN
INTRAMUSCULAR | Status: AC
  Filled 2015-05-23: qty 5

## 2015-05-23 MED ORDER — LACTATED RINGERS IV SOLN
INTRAVENOUS | Status: DC
  Administered 2015-05-23: 20 mL/h via INTRAVENOUS

## 2015-05-23 MED ORDER — DIBUCAINE 1 % RE OINT: 1.0000 "application " | TOPICAL_OINTMENT | RECTAL | Status: DC | PRN

## 2015-05-23 MED ORDER — BUPIVACAINE HCL (PF) 0.25 % IJ SOLN
INTRAMUSCULAR | Status: DC | PRN
  Administered 2015-05-23: 10 mL

## 2015-05-23 MED ORDER — ONDANSETRON HCL 4 MG/2ML IJ SOLN
4.0000 mg | INTRAMUSCULAR | Status: DC | PRN
  Administered 2015-05-23: 4 mg via INTRAVENOUS

## 2015-05-23 MED ORDER — KETOROLAC TROMETHAMINE 30 MG/ML IJ SOLN
INTRAMUSCULAR | Status: AC
  Filled 2015-05-23: qty 1

## 2015-05-23 MED ORDER — FAMOTIDINE 20 MG PO TABS
40.0000 mg | ORAL_TABLET | Freq: Once | ORAL | Status: AC
  Administered 2015-05-23: 40 mg via ORAL
  Filled 2015-05-23: qty 2

## 2015-05-23 MED ORDER — HYDROMORPHONE HCL 1 MG/ML IJ SOLN
INTRAMUSCULAR | Status: AC
  Filled 2015-05-23: qty 1

## 2015-05-23 MED ORDER — SENNOSIDES-DOCUSATE SODIUM 8.6-50 MG PO TABS
2.0000 | ORAL_TABLET | ORAL | Status: DC
  Filled 2015-05-23: qty 2

## 2015-05-23 MED ORDER — DIPHENHYDRAMINE HCL 25 MG PO CAPS: 25.0000 mg | ORAL_CAPSULE | Freq: Four times a day (QID) | ORAL | Status: DC | PRN

## 2015-05-23 MED ORDER — CITRIC ACID-SODIUM CITRATE 334-500 MG/5ML PO SOLN
ORAL | Status: DC | PRN
  Administered 2015-05-23: 30 mL via ORAL

## 2015-05-23 MED ORDER — ROCURONIUM BROMIDE 100 MG/10ML IV SOLN
INTRAVENOUS | Status: DC | PRN
  Administered 2015-05-23: 30 mg via INTRAVENOUS

## 2015-05-23 MED ORDER — PROPOFOL 10 MG/ML IV BOLUS
INTRAVENOUS | Status: DC | PRN
  Administered 2015-05-23: 200 mg via INTRAVENOUS

## 2015-05-23 MED ORDER — CITRIC ACID-SODIUM CITRATE 334-500 MG/5ML PO SOLN
ORAL | Status: AC
  Filled 2015-05-23: qty 15

## 2015-05-23 MED ORDER — NEOSTIGMINE METHYLSULFATE 10 MG/10ML IV SOLN
INTRAVENOUS | Status: DC | PRN
  Administered 2015-05-23: 3 mg via INTRAVENOUS

## 2015-05-23 MED ORDER — SUCCINYLCHOLINE CHLORIDE 20 MG/ML IJ SOLN
INTRAMUSCULAR | Status: AC
  Filled 2015-05-23: qty 1

## 2015-05-23 MED ORDER — BENZOCAINE-MENTHOL 20-0.5 % EX AERO
1.0000 "application " | INHALATION_SPRAY | CUTANEOUS | Status: DC | PRN
  Administered 2015-05-23: 1 via TOPICAL
  Filled 2015-05-23: qty 56

## 2015-05-23 MED ORDER — DEXAMETHASONE SODIUM PHOSPHATE 4 MG/ML IJ SOLN
INTRAMUSCULAR | Status: AC
  Filled 2015-05-23: qty 1

## 2015-05-23 MED ORDER — WITCH HAZEL-GLYCERIN EX PADS: 1.0000 "application " | MEDICATED_PAD | CUTANEOUS | Status: DC | PRN

## 2015-05-23 MED ORDER — ACETAMINOPHEN 325 MG PO TABS
650.0000 mg | ORAL_TABLET | ORAL | Status: DC | PRN
  Administered 2015-05-23: 650 mg via ORAL
  Filled 2015-05-23 (×2): qty 2

## 2015-05-23 MED ORDER — ONDANSETRON HCL 4 MG PO TABS: 4.0000 mg | ORAL_TABLET | ORAL | Status: DC | PRN

## 2015-05-23 MED ORDER — TETANUS-DIPHTH-ACELL PERTUSSIS 5-2.5-18.5 LF-MCG/0.5 IM SUSP
0.5000 mL | Freq: Once | INTRAMUSCULAR | Status: DC
  Filled 2015-05-23: qty 0.5

## 2015-05-23 MED ORDER — MIDAZOLAM HCL 5 MG/5ML IJ SOLN
INTRAMUSCULAR | Status: DC | PRN
  Administered 2015-05-23: 2 mg via INTRAVENOUS

## 2015-05-23 SURGICAL SUPPLY — 23 items
CHLORAPREP W/TINT 26ML (MISCELLANEOUS) ×3 IMPLANT
CONTAINER PREFILL 10% NBF 15ML (MISCELLANEOUS) ×6 IMPLANT
DRSG OPSITE POSTOP 3X4 (GAUZE/BANDAGES/DRESSINGS) ×3 IMPLANT
GLOVE BIOGEL PI IND STRL 6.5 (GLOVE) ×1 IMPLANT
GLOVE BIOGEL PI IND STRL 7.0 (GLOVE) ×1 IMPLANT
GLOVE BIOGEL PI INDICATOR 6.5 (GLOVE) ×2
GLOVE BIOGEL PI INDICATOR 7.0 (GLOVE) ×2
GLOVE SURG SS PI 6.0 STRL IVOR (GLOVE) ×3 IMPLANT
GOWN STRL REUS W/TWL LRG LVL3 (GOWN DISPOSABLE) ×6 IMPLANT
NEEDLE HYPO 22GX1.5 SAFETY (NEEDLE) ×2 IMPLANT
NS IRRIG 1000ML POUR BTL (IV SOLUTION) ×3 IMPLANT
PACK ABDOMINAL MINOR (CUSTOM PROCEDURE TRAY) ×3 IMPLANT
SPONGE GAUZE 2X2 8PLY STER LF (GAUZE/BANDAGES/DRESSINGS) ×1
SPONGE GAUZE 2X2 8PLY STRL LF (GAUZE/BANDAGES/DRESSINGS) ×2 IMPLANT
SPONGE LAP 4X18 X RAY DECT (DISPOSABLE) IMPLANT
SUT PLAIN 0 NONE (SUTURE) ×3 IMPLANT
SUT VIC AB 0 CT1 27 (SUTURE) ×3
SUT VIC AB 0 CT1 27XBRD ANBCTR (SUTURE) ×1 IMPLANT
SUT VIC AB 3-0 PS2 18 (SUTURE) ×3 IMPLANT
SYR CONTROL 10ML LL (SYRINGE) ×2 IMPLANT
TOWEL OR 17X24 6PK STRL BLUE (TOWEL DISPOSABLE) ×6 IMPLANT
TRAY FOLEY CATH SILVER 14FR (SET/KITS/TRAYS/PACK) ×1 IMPLANT
WATER STERILE IRR 1000ML POUR (IV SOLUTION) ×3 IMPLANT

## 2015-05-23 NOTE — Progress Notes (Signed)
Patient ID: Angela DoffingBarbara Maldonado, female   DOB: 12/07/1985, 30 y.o.   MRN: [id number] 30 y.o. yo [phone]  with undesired fertility,status post vaginal delivery, desires permanent sterilization. Risks and benefits of procedure discussed with patient including permanence of method, bleeding, infection, injury to surrounding organs and need for additional procedures. Risk failure of 0.5-1% with increased risk of ectopic gestation if pregnancy occurs was also discussed with patient.

## 2015-05-23 NOTE — Anesthesia Postprocedure Evaluation (Signed)
Anesthesia Post Note  Patient: Angela DoffingBarbara Maldonado  Procedure(s) Performed: Procedure(s) (LRB): POST PARTUM TUBAL LIGATION (Bilateral)  Patient location during evaluation: Mother Baby Anesthesia Type: General Level of consciousness: awake, awake and alert, oriented and patient cooperative Pain management: pain level controlled Vital Signs Assessment: post-procedure vital signs reviewed and stable Respiratory status: spontaneous breathing, nonlabored ventilation and respiratory function stable Cardiovascular status: stable Postop Assessment: no headache, no backache, patient able to bend at knees and no signs of nausea or vomiting Anesthetic complications: no    Last Vitals:  Filed Vitals:   05/23/15 1611 05/23/15 1715  BP: 119/78 129/71  Pulse: 81 87  Temp: 36.6 C 36.4 C  Resp: 16 18    Last Pain:  Filed Vitals:   05/23/15 1723  PainSc: 4                  Anasia Agro L

## 2015-05-23 NOTE — Progress Notes (Signed)
At 0915 this Am I notified Dr Renold DonGermeroth notified that epidural was [facility]

## 2015-05-23 NOTE — Anesthesia Postprocedure Evaluation (Signed)
Anesthesia Post Note  Patient: Angela DoffingBarbara Maldonado  Procedure(s) Performed: * No procedures listed *  Patient location during evaluation: Mother Baby Anesthesia Type: Epidural Level of consciousness: awake, awake and alert, oriented and patient cooperative Pain management: pain level controlled Vital Signs Assessment: post-procedure vital signs reviewed and stable Respiratory status: spontaneous breathing, nonlabored ventilation and respiratory function stable Cardiovascular status: stable Postop Assessment: no headache, no backache, patient able to bend at knees and no signs of nausea or vomiting Anesthetic complications: no    Last Vitals:  Filed Vitals:   05/23/15 1611 05/23/15 1715  BP: 119/78 129/71  Pulse: 81 87  Temp: 36.6 C 36.4 C  Resp: 16 18    Last Pain:  Filed Vitals:   05/23/15 1723  PainSc: 4                  Finneus Kaneshiro L

## 2015-05-23 NOTE — Anesthesia Postprocedure Evaluation (Signed)
Anesthesia Post Note  Patient: Angela DoffingBarbara Maldonado  Procedure(s) Performed: Procedure(s) (LRB): POST PARTUM TUBAL LIGATION (Bilateral)  Patient location during evaluation: PACU Anesthesia Type: General Level of consciousness: sedated and patient cooperative Pain management: pain level controlled Vital Signs Assessment: post-procedure vital signs reviewed and stable Respiratory status: spontaneous breathing Cardiovascular status: stable Anesthetic complications: no    Last Vitals:  Filed Vitals:   05/23/15 1545 05/23/15 1611  BP: 110/74 119/78  Pulse: 81 81  Temp: 36.8 C 36.6 C  Resp: 15 16    Last Pain:  Filed Vitals:   05/23/15 1622  PainSc: 4                  Leelan Rajewski Motorolaermeroth

## 2015-05-23 NOTE — Anesthesia Preprocedure Evaluation (Addendum)
Anesthesia Evaluation  Patient identified by MRN, date of birth, ID band Patient awake    Reviewed: Allergy & Precautions, H&P , NPO status , Patient's Chart, lab work & pertinent test results  Airway Mallampati: II  TM Distance: >3 FB Neck ROM: full    Dental no notable dental hx.    Pulmonary neg pulmonary ROS, Current Smoker,    Pulmonary exam normal        Cardiovascular negative cardio ROS Normal cardiovascular exam Rhythm:Regular     Neuro/Psych negative neurological ROS  negative psych ROS   GI/Hepatic negative GI ROS, Neg liver ROS,   Endo/Other  diabetes, Gestational  Renal/GU negative Renal ROS  negative genitourinary   Musculoskeletal negative musculoskeletal ROS (+)   Abdominal (+) + obese,   Peds  Hematology negative hematology ROS (+)   Anesthesia Other Findings   Reproductive/Obstetrics negative OB ROS                            Anesthesia Physical  Anesthesia Plan  ASA: II  Anesthesia Plan: General   Post-op Pain Management:    Induction: Intravenous, Rapid sequence and Cricoid pressure planned  Airway Management Planned: Oral ETT  Additional Equipment:   Intra-op Plan:   Post-operative Plan: Extubation in OR  Informed Consent: I have reviewed the patients History and Physical, chart, labs and discussed the procedure including the risks, benefits and alternatives for the proposed anesthesia with the patient or authorized representative who has indicated his/her understanding and acceptance.   Dental advisory given  Plan Discussed with: CRNA  Anesthesia Plan Comments:         Anesthesia Quick Evaluation

## 2015-05-23 NOTE — Anesthesia Procedure Notes (Addendum)
Procedure Name: Intubation Date/Time: 05/23/2015 2:09 PM Performed by: Jhonnie GarnerMARSHALL, Kaityln Kallstrom M Pre-anesthesia Checklist: Patient identified, Emergency Drugs available, Suction available and Patient being monitored Patient Re-evaluated:Patient Re-evaluated prior to inductionOxygen Delivery Method: Circle system utilized Preoxygenation: Pre-oxygenation with 100% oxygen Intubation Type: IV induction, Rapid sequence and Cricoid Pressure applied Laryngoscope Size: Miller and 2 Grade View: Grade I Tube type: Oral Tube size: 7.0 mm Number of attempts: 1 Airway Equipment and Method: Stylet Placement Confirmation: ETT inserted through vocal cords under direct vision,  positive ETCO2 and breath sounds checked- equal and bilateral Secured at: 22 cm Tube secured with: Tape Dental Injury: Teeth and Oropharynx as per pre-operative assessment    Performed by: Jhonnie GarnerMARSHALL, Tachina Spoonemore M

## 2015-05-23 NOTE — Transfer of Care (Signed)
Immediate Anesthesia Transfer of Care Note  Patient: Angela Maldonado  Procedure(s) Performed: Procedure(s): POST PARTUM TUBAL LIGATION (Bilateral)  Patient Location: PACU  Anesthesia Type:General  Level of Consciousness: sedated  Airway & Oxygen Therapy: Patient Spontanous Breathing and Patient connected to nasal cannula oxygen  Post-op Assessment: Report given to RN  Post vital signs: Reviewed  Last Vitals:  Filed Vitals:   05/23/15 1235 05/23/15 1320  BP: 120/76   Pulse: 104   Temp:  36.6 C  Resp: 20     Complications: No apparent anesthesia complications

## 2015-05-23 NOTE — Op Note (Signed)
PREOPERATIVE DIAGNOSIS:  Undesired fertility  POSTOPERATIVE DIAGNOSIS:  Undesired fertility  PROCEDURE:  Postpartum Bilateral Tubal Sterilization using Pomeroy method   ANESTHESIA:  Epidural  COMPLICATIONS:  None immediate.  ESTIMATED BLOOD LOSS:  Less than 20cc.  FLUIDS: 800 cc LR.  INDICATIONS: 30 y.o. yo [id number]  with undesired fertility,status post vaginal delivery, desires permanent sterilization. Risks and benefits of procedure discussed with patient including permanence of method, bleeding, infection, injury to surrounding organs and need for additional procedures. Risk failure of 0.5-1% with increased risk of ectopic gestation if pregnancy occurs was also discussed with patient.   FINDINGS:  Normal uterus, tubes, and ovaries.  TECHNIQUE: After informed consent was obtained, the patient was taken to the operating room where anesthesia was induced and found to be adequate. A small transverse, infraumbilical skin incision was made with the scalpel. This incision was carried down to the underlying layer of fascia. The fascia was grasped with Kocher clamps tented up and entered sharply with Mayo scissors. Underlying peritoneum was then identified tented up and entered sharply with Metzenbaum scissors. The fascia was tagged with 0 Vicryl. The patient's left fallopian tube was then identified, brought to the incision, and grasped with a Babcock clamp. The tube was then followed out to the fimbria. The Babcock clamp was then used to grasp the tube approximately 4 cm from the cornual region. A 3 cm segment of the tube was then ligated with free tie of plain gut suture, transected and excised. Good hemostasis was noted and the tube was returned to the abdomen. The right fallopian tube was then identified to its fimbriated end, ligated, and a 3 cm segment excised in a similar fashion. Excellent hemostasis was noted, and the tube returned to the abdomen. The fascia was re-approximated with 0 Vicryl.  The skin was closed in a subcuticular fashion with 3-0 Vicryl. Quarter percent Marcaine solution was then injected at the incision site. The patient tolerated the procedure well. Sponge, lap, and needle count were correct x2. The patient was taken to recovery room in stable condition.

## 2015-05-23 NOTE — Progress Notes (Signed)
Patient ID: Angela DoffingBarbara Maldonado, female   DOB: 01/20/1986, 30 y.o.   MRN: [id number]  CTSP for FHR brady to nadir of 70's w/ total time down x 9-3810min beginning at 0340.  Dr Emelda FearFerguson called in to eval as well. Given IVF and O2, and FHR recovered while pt in hands & knees w/ good ST variability. Cx w/ ant lip at 0 station, once foley bulb encouraged to pop back above head; vtx also in OP position. Decision to restart Pit after 30 mins if FHR stable; still working towards vag del.  Cam HaiSHAW, Niall Illes 05/23/2015 4:11 AM

## 2015-05-23 NOTE — Progress Notes (Signed)
UR chart review completed.  

## 2015-05-23 NOTE — Addendum Note (Signed)
Addendum  created 05/23/15 1731 by Yolonda KidaAlison L Alyria Krack, CRNA   Modules edited: Clinical Notes   Clinical Notes:  File: [id number]

## 2015-05-24 MED ORDER — OXYCODONE-ACETAMINOPHEN 5-325 MG PO TABS
1.0000 | ORAL_TABLET | ORAL | Status: DC | PRN
  Administered 2015-05-24 (×2): 1 via ORAL
  Administered 2015-05-24: 2 via ORAL
  Administered 2015-05-24 – 2015-05-25 (×4): 1 via ORAL
  Administered 2015-05-25: 2 via ORAL
  Filled 2015-05-24 (×4): qty 1
  Filled 2015-05-24 (×2): qty 2
  Filled 2015-05-24 (×2): qty 1

## 2015-05-24 NOTE — Progress Notes (Signed)
Post Partum Day 1, POD#1 (BTL) Subjective: up ad lib, voiding, tolerating PO and incisional pain and abdominal gas  Objective: Blood pressure 128/86, pulse 82, temperature 97.3 F (36.3 C), temperature source Oral, resp. rate 16, height 5\' 4"  (1.626 m), weight 213 lb (96.616 kg), SpO2 97 %, unknown if currently breastfeeding.  Physical Exam:  General: alert, cooperative and no distress Lochia: appropriate Uterine Fundus: firm Incision: healing well, no significant drainage DVT Evaluation: No evidence of DVT seen on physical exam.   Recent Labs  05/22/15 0255  HGB 9.8*  HCT 30.2*    Assessment/Plan: Plan for discharge tomorrow and Breastfeeding  May want to try breastfeeding, Lactation will see   LOS: 2 days   [facility] CenterWILLIAMS,Daeveon Zweber 05/24/2015, 11:26 AM

## 2015-05-24 NOTE — Plan of Care (Signed)
Problem: Nutritional: Goal: Mother's verbalization of comfort with breastfeeding process will improve Outcome: Not Applicable Date Met:  84/57/33 Bottle feeding

## 2015-05-24 NOTE — Progress Notes (Signed)
POSTPARTUM PROGRESS NOTE  Post Partum Day 1 Subjective:  Angela Maldonado is a 30 y.o. [id number] 2413w0d s/p SVD IOL PD and ppBTL.  No acute events overnight.  Pt denies problems with ambulating, voiding or po intake.  She denies nausea or vomiting.  Pain is well controlled, down to 5/10.  She has had flatus. She has had bowel movement.  Lochia Minimal.   Objective: Blood pressure 129/89, pulse 82, temperature 98.2 F (36.8 C), temperature source Oral, resp. rate 16, height 5\' 4"  (1.626 m), weight 96.616 kg (213 lb), SpO2 97 %, breast and bottle feeding.  Physical Exam:  General: alert, cooperative and no distress Lochia:normal flow Chest: CTAB Heart: RRR  Uterine Fundus: firm, nontender U0 DVT Evaluation: No calf swelling or tenderness Extremities: no edema   Recent Labs  05/22/15 0255  HGB 9.8*  HCT 30.2*    Assessment/Plan:  ASSESSMENT: Angela Maldonado is a 30 y.o. [id number] 2213w0d s/p SVD IOL PD. Patient doing well and can be discharged today if desired.    LOS: 2 days   Estel Scholze Eloise HarmanFarawi 05/24/2015, 7:54 AM

## 2015-05-25 ENCOUNTER — Encounter (HOSPITAL_COMMUNITY): Payer: Self-pay | Admitting: Obstetrics and Gynecology

## 2015-05-25 MED ORDER — OXYCODONE-ACETAMINOPHEN 5-325 MG PO TABS: 1.0000 | ORAL_TABLET | ORAL | Status: DC | PRN

## 2015-05-25 MED ORDER — IBUPROFEN 600 MG PO TABS: 600.0000 mg | ORAL_TABLET | Freq: Four times a day (QID) | ORAL | Status: AC | PRN

## 2015-05-25 NOTE — Discharge Instructions (Signed)
Laparoscopic Tubal Ligation, Care After Refer to this sheet in the next few weeks. These instructions provide you with information about caring for yourself after your procedure. Your health care provider may also give you more specific instructions. Your treatment has been planned according to current medical practices, but problems sometimes occur. Call your health care provider if you have any problems or questions after your procedure. WHAT TO EXPECT AFTER THE PROCEDURE After your procedure, it is common to have:  Sore throat.  Soreness at the incision site.  Mild cramping.  Tiredness.  Mild nausea or vomiting.  Shoulder pain. HOME CARE INSTRUCTIONS  Rest for the remainder of the day.  Take medicines only as directed by your health care provider. These include over-the-counter medicines and prescription medicines. Do not take aspirin, which can cause bleeding.  Over the next few days, gradually return to your normal activities and your normal diet.  Avoid sexual intercourse for 2 weeks or as directed by your health care provider.  Do not use tampons, and do not douche.  Do not drive or operate heavy machinery while taking pain medicine.  Do not lift anything that is heavier than 5 lb (2.3 kg) for 2 weeks or as directed by your health care provider.  Do not take baths. Take showers only. Ask your health care provider when you can start taking baths.  Take your temperature twice each day and write it down.  Try to have help for your household needs for the first 7-10 days.  There are many different ways to close and cover an incision, including stitches (sutures), skin glue, and adhesive strips. Follow instructions from your health care provider about:  Incision care.  Bandage (dressing) changes and removal.  Incision closure removal.  Check your incision area every day for signs of infection. Watch for:  Redness, swelling, or pain.  Fluid, blood, or pus.  Keep  all follow-up visits as directed by your health care provider. SEEK MEDICAL CARE IF:  You have redness, swelling, or increasing pain in your incision area.  You have fluid, blood, or pus coming from your incision for longer than 1 day.  You notice a bad smell coming from your incision or your dressing.  The edges of your incision break open after the sutures have been removed.  Your pain does not decrease after 2-3 days.  You have a rash.  You repeatedly become dizzy or light-headed.  You have a reaction to your medicine.  Your pain medicine is not helping.  You are constipated. SEEK IMMEDIATE MEDICAL CARE IF:  You have a fever.  You faint.  You have increasing pain in your abdomen.  You have severe pain in one or both of your shoulders.  You have bleeding or drainage from your suture sites or your vagina after surgery.  You have shortness of breath or have difficulty breathing.  You have chest pain or leg pain.  You have ongoing nausea, vomiting, or diarrhea.   This information is not intended to replace advice given to you by your health care provider. Make sure you discuss any questions you have with your health care provider.   Document Released: 09/05/2004 Document Revised: 07/03/2014 Document Reviewed: 05/30/2011 Elsevier Interactive Patient Education 2016 Else[employer] Inc[employer] Postpartum Care After Vaginal Delivery After you deliver your newborn (postpartum period), the usual stay in the hospital is 24-72 hours. If there were problems with your labor or delivery, or if you have other medical problems, you might be in  the hospital longer.  While you are in the hospital, you will receive help and instructions on how to care for yourself and your newborn during the postpartum period.  While you are in the hospital:  Be sure to tell your nurses if you have pain or discomfort, as well as where you feel the pain and what makes the pain worse.  If you had an incision made  near your vagina (episiotomy) or if you had some tearing during delivery, the nurses may put ice packs on your episiotomy or tear. The ice packs may help to reduce the pain and swelling.  If you are breastfeeding, you may feel uncomfortable contractions of your uterus for a couple of weeks. This is normal. The contractions help your uterus get back to normal size.  It is normal to have some bleeding after delivery.  For the first 1-3 days after delivery, the flow is red and the amount may be similar to a period.  It is common for the flow to start and stop.  In the first few days, you may pass some small clots. Let your nurses know if you begin to pass large clots or your flow increases.  Do not  flush blood clots down the toilet before having the nurse look at them.  During the next 3-10 days after delivery, your flow should become more watery and pink or brown-tinged in color.  Ten to fourteen days after delivery, your flow should be a small amount of yellowish-white discharge.  The amount of your flow will decrease over the first few weeks after delivery. Your flow may stop in 6-8 weeks. Most women have had their flow stop by 12 weeks after delivery.  You should change your sanitary pads frequently.  Wash your hands thoroughly with soap and water for at least 20 seconds after changing pads, using the toilet, or before holding or feeding your newborn.  You should feel like you need to empty your bladder within the first 6-8 hours after delivery.  In case you become weak, lightheaded, or faint, call your nurse before you get out of bed for the first time and before you take a shower for the first time.  Within the first few days after delivery, your breasts may begin to feel tender and full. This is called engorgement. Breast tenderness usually goes away within 48-72 hours after engorgement occurs. You may also notice milk leaking from your breasts. If you are not breastfeeding, do not  stimulate your breasts. Breast stimulation can make your breasts produce more milk.  Spending as much time as possible with your newborn is very important. During this time, you and your newborn can feel close and get to know each other. Having your newborn stay in your room (rooming in) will help to strengthen the bond with your newborn. It will give you time to get to know your newborn and become comfortable caring for your newborn.  Your hormones change after delivery. Sometimes the hormone changes can temporarily cause you to feel sad or tearful. These feelings should not last more than a few days. If these feelings last longer than that, you should talk to your caregiver.  If desired, talk to your caregiver about methods of family planning or contraception.  Talk to your caregiver about immunizations. Your caregiver may want you to have the following immunizations before leaving the hospital:  Tetanus, diphtheria, and pertussis (Tdap) or tetanus and diphtheria (Td) immunization. It is very important that you and  your family (including grandparents) or others caring for your newborn are up-to-date with the Tdap or Td immunizations. The Tdap or Td immunization can help protect your newborn from getting ill.  Rubella immunization.  Varicella (chickenpox) immunization.  Influenza immunization. You should receive this annual immunization if you did not receive the immunization during your pregnancy.   This information is not intended to replace advice given to you by your health care provider. Make sure you discuss any questions you have with your health care provider.   Document Released: 12/14/2006 Document Revised: 11/11/2011 Document Reviewed: 10/14/2011 Elsevier Interactive Patient Education [employer]2016 Elsevier Inc.

## 2015-05-25 NOTE — Discharge Summary (Signed)
OB Discharge Summary     Patient Name: Angela Maldonado DOB: December 14, 1985 MRN: [id number]  Date of admission: 05/22/2015 Delivering MD: Cam Hai D   Date of discharge: 05/25/2015  Admitting diagnosis: INDUCTION desires sterilization Intrauterine pregnancy: [redacted]w[redacted]d     Secondary diagnosis:  Active Problems:   Post term pregnancy   Encounter for sterilization  Additional problems: none     Discharge diagnosis: Term Pregnancy Delivered and PP sterilization                                                                                                Post partum procedures:postpartum tubal ligation  Augmentation: Pitocin, Cytotec and Foley Balloon  Complications: None  Hospital course:  Induction of Labor With Vaginal Delivery   30 y.o. yo [id number] at [redacted]w[redacted]d was admitted to the hospital 05/22/2015 for induction of labor.  Indication for induction: Postdates.  Patient had an uncomplicated labor course as follows: Membrane Rupture Time/Date: 7:30 PM ,05/22/2015   Intrapartum Procedures: Episiotomy: None [1]                                         Lacerations:  None [1]  Patient had delivery of a Viable infant.  Information for the patient's newborn:  Hagen, Tidd [[id number]]  Delivery Method: Vaginal, Spontaneous Delivery (Filed from Delivery Summary)   05/23/2015  Details of delivery can be found in separate delivery note.  Patient had a routine postpartum course. Patient is discharged home 05/25/2015.   Physical exam  Filed Vitals:   05/24/15 0900 05/24/15 1330 05/24/15 1700 05/25/15 0548  BP: 128/86 121/80 132/82 134/84  Pulse: 82 97 92 103  Temp: 97.3 F (36.3 C) 97.7 F (36.5 C) 97.8 F (36.6 C) 98.3 F (36.8 C)  TempSrc: Oral Oral Oral Oral  Resp: Height:      Weight:      SpO2: 97% 98%  96%   General: alert and cooperative Lochia: appropriate Uterine Fundus: firm Incision: Dressing is clean, dry, and intact DVT Evaluation: No evidence of DVT  seen on physical exam. Labs: Lab Results  Component Value Date   WBC 10.3 05/22/2015   HGB 9.8* 05/22/2015   HCT 30.2* 05/22/2015   MCV 83.4 05/22/2015   PLT 236 05/22/2015   CMP Latest Ref Rng 10/19/2014  Glucose 65 - 99 mg/dL 130(Q)  BUN 6 - 20 mg/dL 10  Creatinine 6.57 - 8.46 mg/dL 9.62  Sodium 952 - 841 mmol/L 136  Potassium 3.5 - 5.1 mmol/L 3.8  Chloride 101 - 111 mmol/L 105  CO2 22 - 32 mmol/L 23  Calcium 8.9 - 10.3 mg/dL 9.3    Discharge instruction: per After Visit Summary and "Baby and Me Booklet".  After visit meds:    Medication List    STOP taking these medications        calcium carbonate 500 MG chewable tablet  Commonly known as:  TUMS - dosed in mg elemental calcium  TAKE these medications        ibuprofen 600 MG tablet  Commonly known as:  ADVIL,MOTRIN  Take 1 tablet (600 mg total) by mouth every 6 (six) hours as needed.     oxyCODONE-acetaminophen 5-325 MG tablet  Commonly known as:  PERCOCET/ROXICET  Take 1-2 tablets by mouth every 4 (four) hours as needed for moderate pain.        Diet: routine diet  Activity: Advance as tolerated. Pelvic rest for 6 weeks.   Outpatient follow up:6 weeks Follow up Appt:Future Appointments Date Time Provider Department Center  06/26/2015 3:00 PM Marlis EdelsonWalidah N Karim, CNM [facility]   Follow up Visit:No Follow-up on file.  Postpartum contraception: Tubal Ligation  Newborn Data: Live born female  Birth Weight: 8 lb 6.4 oz (3810 g) APGAR: 8, 9  Baby Feeding: Bottle Disposition:home with mother   05/25/2015 Cam HaiSHAW, Zayne Marovich, CNM  9:27 AM

## 2015-06-19 ENCOUNTER — Ambulatory Visit (INDEPENDENT_AMBULATORY_CARE_PROVIDER_SITE_OTHER): Payer: Medicaid Other | Admitting: Family

## 2015-06-19 ENCOUNTER — Encounter: Payer: Self-pay | Admitting: Family

## 2015-06-19 NOTE — Progress Notes (Signed)
Patient ID: Angela DoffingBarbara Maldonado, female   DOB: 03/19/1985, 30 y.o.   MRN: [id number] Subjective:     Angela DoffingBarbara Maldonado is a 30 y.o. female who presents for a postpartum visit. She is 4 weeks postpartum following a spontaneous vaginal delivery. I have fully reviewed the prenatal and intrapartum course. The delivery was at 7731w6d gestational weeks. Outcome: spontaneous vaginal delivery. Anesthesia: epidural. Postpartum course has been normal. Baby's course has been normal. Baby is feeding by bottle - Similac Advance. Bleeding no bleeding. Last bleeding approximately one week ago.  Bowel function is normal. Bladder function is normal. Patient is sexually active. Contraception method is tubal ligation. Postpartum depression screening: negative.  The following portions of the patient's history were reviewed and updated as appropriate: allergies, current medications, past family history, past medical history, past social history, past surgical history and problem list.  Review of Systems Pertinent items are noted in HPI.   Objective:    BP 127/76 mmHg  Pulse 87  Temp(Src) 98.1 F (36.7 C)  Ht 5\' 5"  (1.651 m)  Wt 183 lb 6.4 oz (83.19 kg)  BMI 30.52 kg/m2  LMP  (LMP Unknown)  Breastfeeding? No   General:  alert, cooperative and appears stated age   Breasts:  inspection negative, no nipple discharge or bleeding, no masses or nodularity palpable  Lungs: clear to auscultation bilaterally  Heart:  regular rate and rhythm, S1, S2 normal, no murmur, click, rub or gallop  Abdomen: soft, non-tender; bowel sounds normal; no masses,  no organomegaly; suture snipped at umbilicus; BTL site healing well  Pelvic exam not indicated - not bleeding, no pain at vaginal area Assessment:     Normal postpartum exam. Pap smear not done at today's visit.   Plan:    1. Contraception: tubal ligation 2. Follow up as needed.    Eino FarberWalidah Kennith GainN Karim, CNM

## 2015-06-26 ENCOUNTER — Ambulatory Visit: Payer: Self-pay | Admitting: Family

## 2015-06-28 ENCOUNTER — Ambulatory Visit: Payer: Self-pay | Admitting: Family Medicine

## 2016-09-18 IMAGING — US US MFM OB FOLLOW-UP
1 series · 12 of 28 positions shown · non-contrast
Comparison: none

[Series 1: us mfm ob follow-up · 0.19mm/px · 12 of 42 slices shown]
[im 2/42]
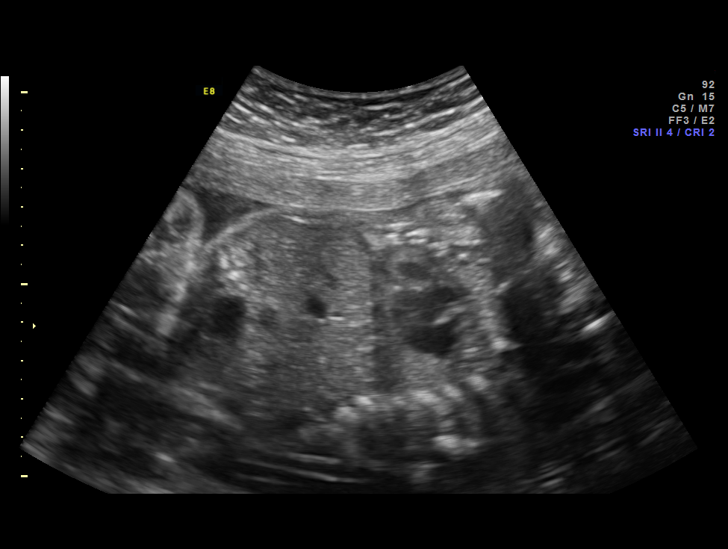
[im 5/42]
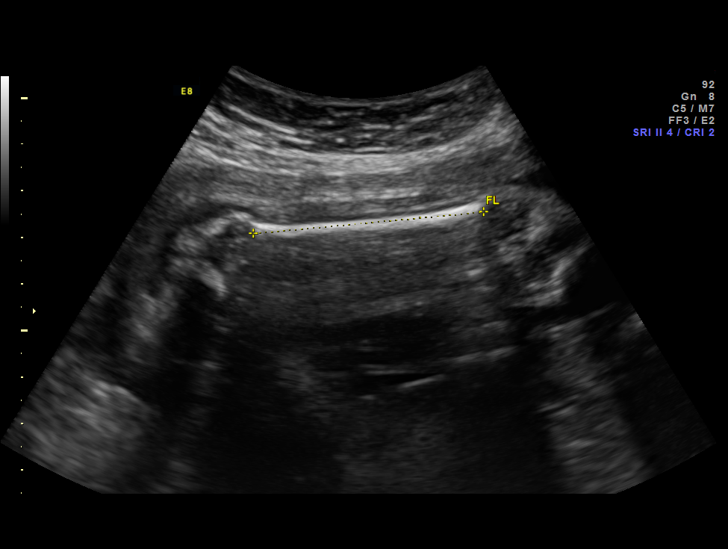
[im 8/42]
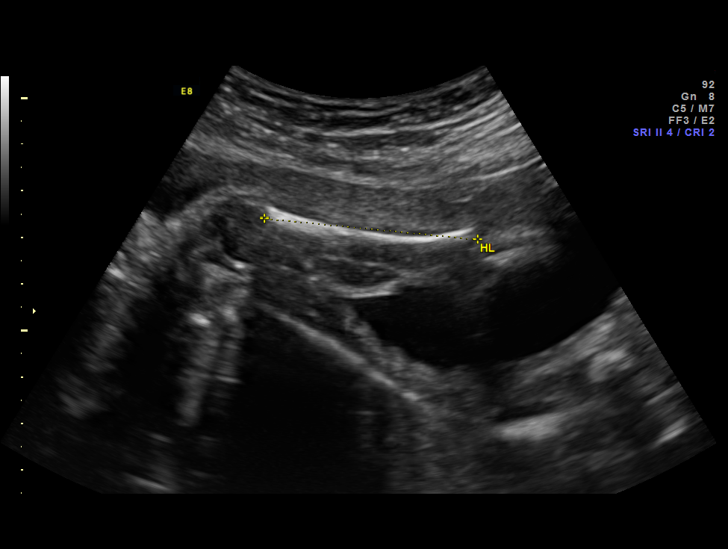
[im 13/42]
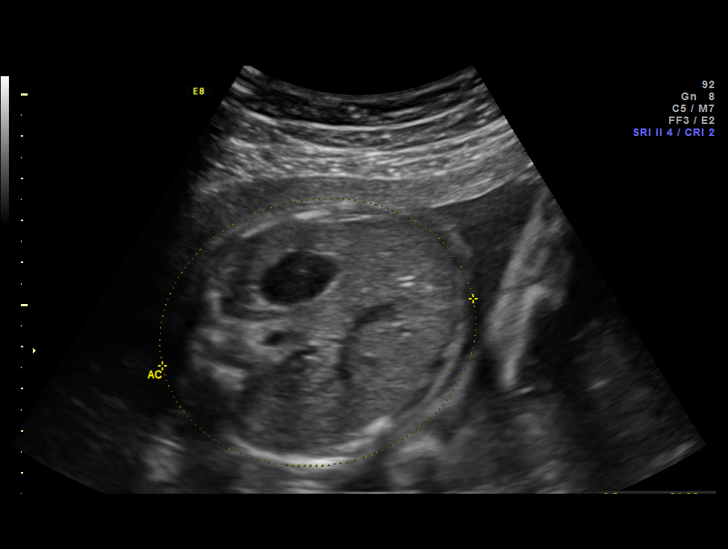
[im 16/42]
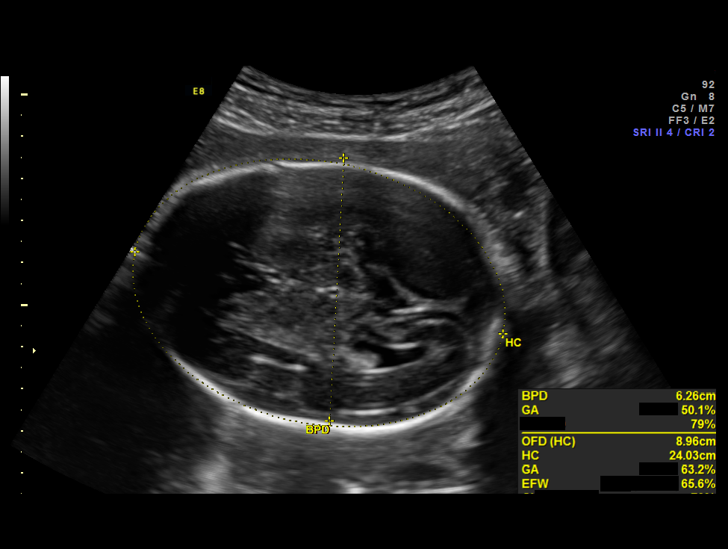
[im 19/42]
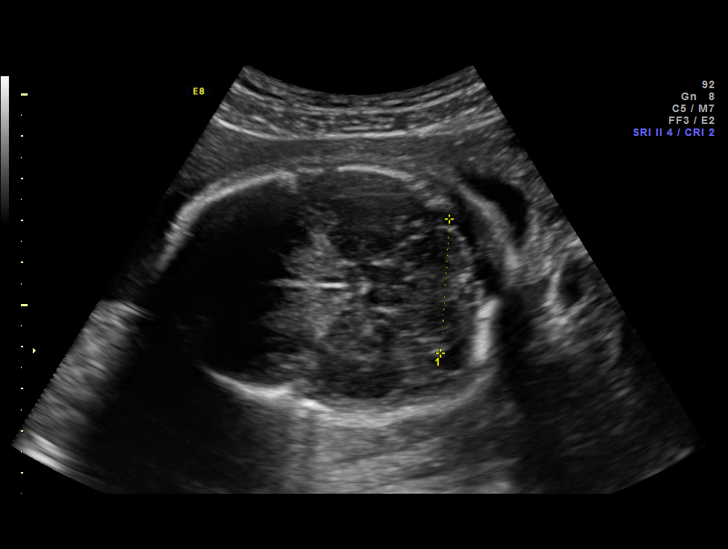
[im 23/42]
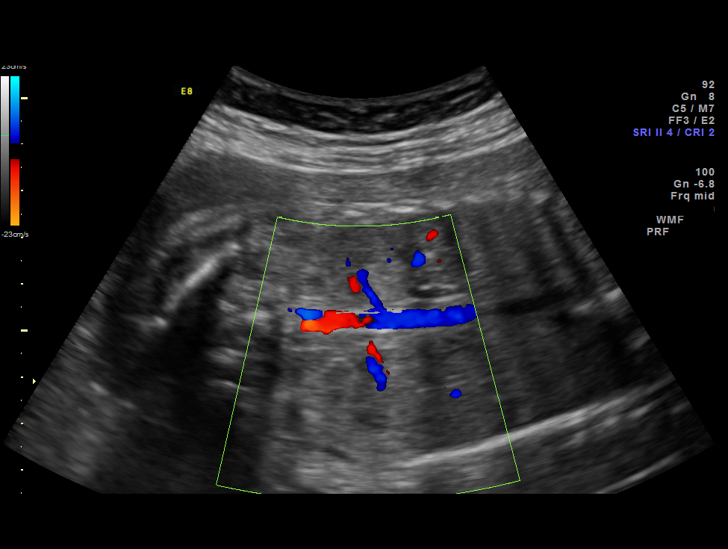
[im 26/42]
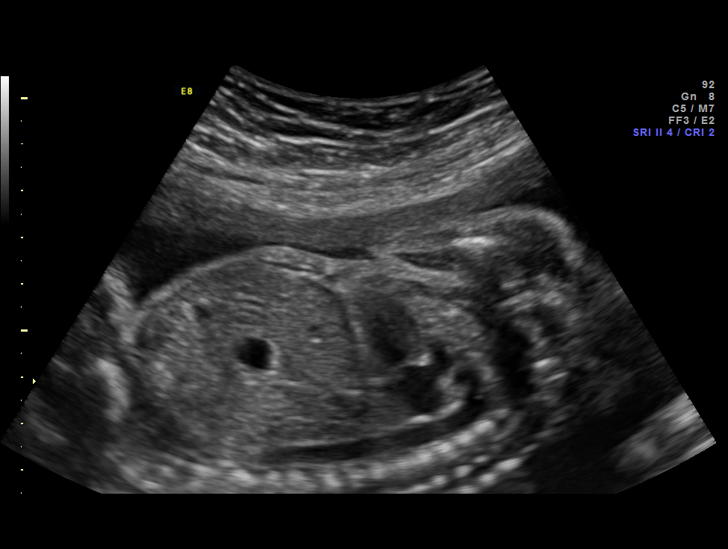
[im 29/42]
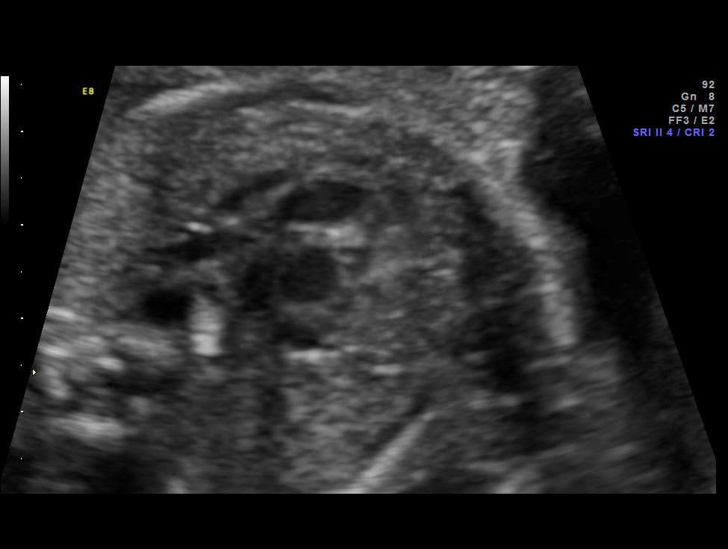
[im 34/42]
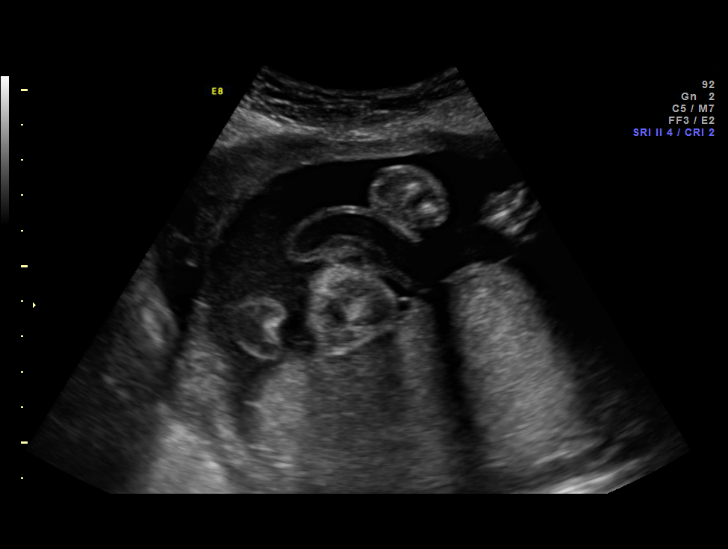
[im 37/42]
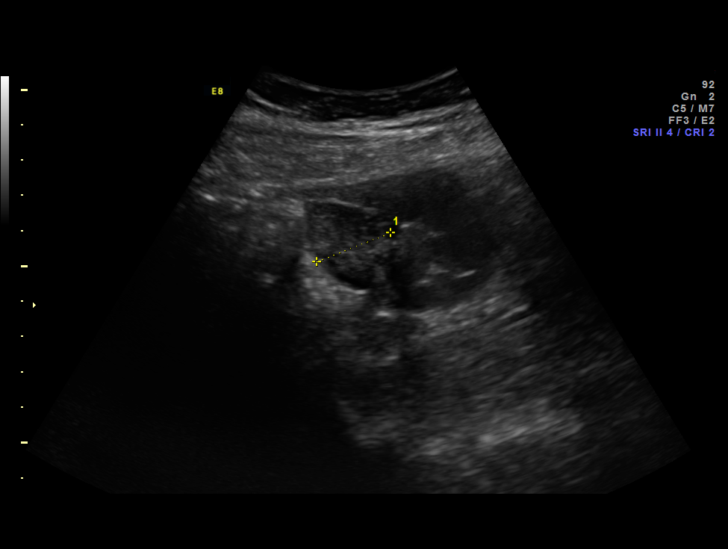
[im 40/42]
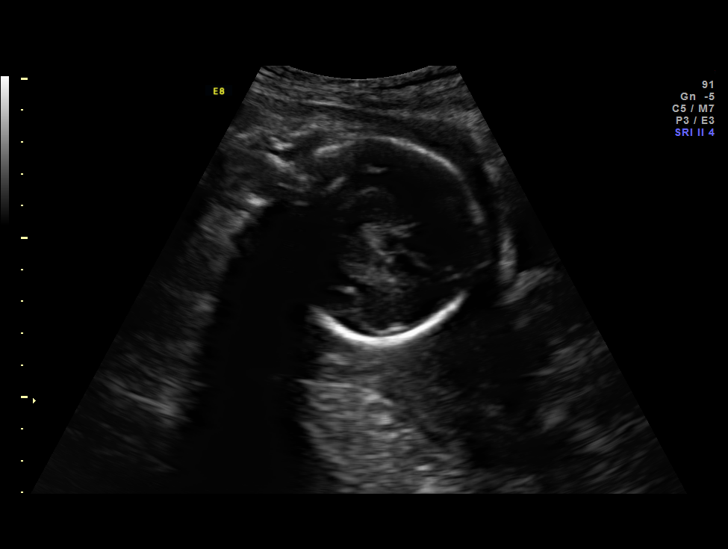

[12 of 28 positions shown; findings below may reference images not displayed]

Hospital Clinic-
Faculty Physician
OB/Gyn Clinic
Ref. Address:     [REDACTED]

Indications

25 weeks gestation of pregnancy
Follow-up incomplete fetal anatomic            Z36
evaluation
OB History

Gravidity:    5         Term:   2         SAB:   2
Fetal Evaluation

Num Of Fetuses:     1
Cardiac Activity:   Observed
Presentation:       Cephalic
Placenta:           Posterior, above cervical os
P. Cord Insertion:  Previously Visualized

Amniotic Fluid
AFI FV:      Subjectively within normal limits
Larg Pckt:       4  cm
Biometry

BPD:      62.3  mm     G. Age:  25w 2d                  CI:         69.1   %    70 - 86
OFD:      90.2  mm                                      FL/HC:      20.3   %    18.7 -
HC:      242.9  mm     G. Age:  26w 3d         72  %    HC/AC:      1.14        1.04 -
AC:      213.8  mm     G. Age:  25w 6d         64  %    FL/BPD:     79.1   %    71 - 87
FL:       49.3  mm     G. Age:  26w 4d         79  %    FL/AC:      23.1   %    20 - 24
HUM:      45.5  mm     G. Age:  26w 6d         86  %

Est. FW:     899  gm           2 lb     73  %
Gestational Age

Clinical EDD:  25w 1d                                        EDD:   05/27/15
U/S Today:     26w 0d                                        EDD:   05/21/15
Best:          25w 1d     Det. By:  Clinical EDD             EDD:   05/27/15
Anatomy

Cranium:          Appears normal         Aortic Arch:      Previously seen
Fetal Cavum:      Appears normal         Ductal Arch:      Appears normal
Ventricles:       Appears normal         Diaphragm:        Appears normal
Choroid Plexus:   Appears normal         Stomach:          Appears normal, left
sided
Cerebellum:       Appears normal         Abdomen:          Appears normal
Posterior Fossa:  Appears normal         Abdominal Wall:   Previously seen
Nuchal Fold:      Not well visualized    Cord Vessels:     Appears normal (3
vessel cord)
Face:             Appears normal         Kidneys:          Appear normal
(orbits and profile)
Lips:             Previously seen        Bladder:          Appears normal
Heart:            Appears normal         Spine:            Previously seen
(4CH, axis, and
situs)
RVOT:             Appears normal         Upper             Previously seen
Extremities:
LVOT:             Appears normal         Lower             Previously seen
Extremities:

Other:  . *Male gender.* previous tech entered in incorrect gender by error.
Cervix Uterus Adnexa

Cervix
Length:            3.7  cm.
Normal appearance by transabdominal scan.

Left Ovary
Size(cm)     2.71   x   1.36   x  2.79      Vol(ml):

Right Ovary
Size(cm)     2.07   x   1.85   x  2.25      Vol(ml):
Impression

Singleton intrauterine pregnancy at 25 weeks 1 day gestation
with fetal cardiac activity
Cephalic presentation
Posterior placenta without evidence of previa
Normal appearing fetal growth and amniotic fluid volume
No apparent birth defects noted on fetal anatomic survey
Cervical length 
3.7cm
Recommendations

Follow-up ultrasounds as clinically indicated.

## 2017-03-30 ENCOUNTER — Encounter: Payer: Self-pay | Admitting: *Deleted
